# Patient Record
Sex: Male | Born: 1955 | Race: Black or African American | Hispanic: No | Smoking: Current every day smoker
Health system: Southern US, Community
[De-identification: ages and names within clinical notes are randomized; demographics above are authoritative.]

## PROBLEM LIST (undated history)

## (undated) DIAGNOSIS — I82409 Acute embolism and thrombosis of unspecified deep veins of unspecified lower extremity: Secondary | ICD-10-CM

## (undated) DIAGNOSIS — E039 Hypothyroidism, unspecified: Secondary | ICD-10-CM

## (undated) DIAGNOSIS — J449 Chronic obstructive pulmonary disease, unspecified: Secondary | ICD-10-CM

## (undated) DIAGNOSIS — I10 Essential (primary) hypertension: Secondary | ICD-10-CM

## (undated) DIAGNOSIS — M1990 Unspecified osteoarthritis, unspecified site: Secondary | ICD-10-CM

## (undated) DIAGNOSIS — B029 Zoster without complications: Secondary | ICD-10-CM

## (undated) DIAGNOSIS — K469 Unspecified abdominal hernia without obstruction or gangrene: Secondary | ICD-10-CM

## (undated) HISTORY — PX: OTHER SURGICAL HISTORY: SHX169

## (undated) HISTORY — PX: COLONOSCOPY: SHX174

## (undated) HISTORY — PX: ARTHROSCOPY KNEE W/ DRILLING: SUR92

## (undated) HISTORY — PX: HERNIA REPAIR: SHX51

---

## 2004-09-25 ENCOUNTER — Emergency Department: Payer: Self-pay | Admitting: Emergency Medicine

## 2004-09-26 ENCOUNTER — Ambulatory Visit: Payer: Self-pay | Admitting: Ophthalmology

## 2005-10-08 ENCOUNTER — Emergency Department: Payer: Self-pay | Admitting: Emergency Medicine

## 2006-05-21 ENCOUNTER — Ambulatory Visit: Payer: Self-pay | Admitting: Surgery

## 2008-06-27 ENCOUNTER — Emergency Department: Payer: Self-pay | Admitting: Emergency Medicine

## 2010-06-21 ENCOUNTER — Ambulatory Visit: Payer: Self-pay | Admitting: Gastroenterology

## 2011-06-14 ENCOUNTER — Emergency Department: Payer: Self-pay | Admitting: *Deleted

## 2012-02-29 ENCOUNTER — Ambulatory Visit: Payer: Self-pay | Admitting: Internal Medicine

## 2012-03-07 ENCOUNTER — Ambulatory Visit: Payer: Self-pay | Admitting: Gastroenterology

## 2012-03-27 ENCOUNTER — Ambulatory Visit: Payer: Self-pay | Admitting: Internal Medicine

## 2014-04-07 ENCOUNTER — Ambulatory Visit: Payer: Self-pay | Admitting: Gastroenterology

## 2014-06-09 ENCOUNTER — Ambulatory Visit: Payer: Self-pay | Admitting: Gastroenterology

## 2014-10-06 ENCOUNTER — Ambulatory Visit: Payer: Self-pay | Admitting: Gastroenterology

## 2015-07-02 ENCOUNTER — Other Ambulatory Visit: Payer: Self-pay | Admitting: Gastroenterology

## 2015-07-02 DIAGNOSIS — K838 Other specified diseases of biliary tract: Secondary | ICD-10-CM

## 2015-07-02 DIAGNOSIS — K760 Fatty (change of) liver, not elsewhere classified: Secondary | ICD-10-CM

## 2015-07-06 ENCOUNTER — Ambulatory Visit
Admission: RE | Admit: 2015-07-06 | Discharge: 2015-07-06 | Disposition: A | Discharge status: Home / Self Care | Source: Ambulatory Visit | Attending: Gastroenterology | Admitting: Gastroenterology

## 2015-07-06 DIAGNOSIS — K838 Other specified diseases of biliary tract: Secondary | ICD-10-CM | POA: Diagnosis present

## 2015-07-06 DIAGNOSIS — K760 Fatty (change of) liver, not elsewhere classified: Secondary | ICD-10-CM | POA: Insufficient documentation

## 2015-07-06 DIAGNOSIS — K8020 Calculus of gallbladder without cholecystitis without obstruction: Secondary | ICD-10-CM | POA: Insufficient documentation

## 2015-08-16 ENCOUNTER — Encounter: Payer: Self-pay | Admitting: *Deleted

## 2015-08-17 ENCOUNTER — Ambulatory Visit: Admitting: Anesthesiology

## 2015-08-17 ENCOUNTER — Encounter
Admission: RE | Disposition: A | Discharge status: Home / Self Care | Payer: Self-pay | Source: Ambulatory Visit | Attending: Gastroenterology

## 2015-08-17 ENCOUNTER — Ambulatory Visit
Admission: RE | Admit: 2015-08-17 | Discharge: 2015-08-17 | Disposition: A | Discharge status: Home / Self Care | Source: Ambulatory Visit | Attending: Gastroenterology | Admitting: Gastroenterology

## 2015-08-17 ENCOUNTER — Encounter: Payer: Self-pay | Admitting: Anesthesiology

## 2015-08-17 DIAGNOSIS — D123 Benign neoplasm of transverse colon: Secondary | ICD-10-CM | POA: Insufficient documentation

## 2015-08-17 DIAGNOSIS — I10 Essential (primary) hypertension: Secondary | ICD-10-CM | POA: Insufficient documentation

## 2015-08-17 DIAGNOSIS — D124 Benign neoplasm of descending colon: Secondary | ICD-10-CM | POA: Diagnosis not present

## 2015-08-17 DIAGNOSIS — K635 Polyp of colon: Secondary | ICD-10-CM | POA: Insufficient documentation

## 2015-08-17 DIAGNOSIS — Z86010 Personal history of colonic polyps: Secondary | ICD-10-CM | POA: Diagnosis present

## 2015-08-17 DIAGNOSIS — Z86718 Personal history of other venous thrombosis and embolism: Secondary | ICD-10-CM | POA: Diagnosis not present

## 2015-08-17 DIAGNOSIS — D127 Benign neoplasm of rectosigmoid junction: Secondary | ICD-10-CM | POA: Diagnosis not present

## 2015-08-17 DIAGNOSIS — K5730 Diverticulosis of large intestine without perforation or abscess without bleeding: Secondary | ICD-10-CM | POA: Diagnosis not present

## 2015-08-17 DIAGNOSIS — D120 Benign neoplasm of cecum: Secondary | ICD-10-CM | POA: Diagnosis not present

## 2015-08-17 DIAGNOSIS — E039 Hypothyroidism, unspecified: Secondary | ICD-10-CM | POA: Diagnosis not present

## 2015-08-17 DIAGNOSIS — Z79899 Other long term (current) drug therapy: Secondary | ICD-10-CM | POA: Insufficient documentation

## 2015-08-17 DIAGNOSIS — Z885 Allergy status to narcotic agent status: Secondary | ICD-10-CM | POA: Insufficient documentation

## 2015-08-17 HISTORY — DX: Unspecified osteoarthritis, unspecified site: M19.90

## 2015-08-17 HISTORY — DX: Unspecified abdominal hernia without obstruction or gangrene: K46.9

## 2015-08-17 HISTORY — DX: Hypothyroidism, unspecified: E03.9

## 2015-08-17 HISTORY — PX: COLONOSCOPY WITH PROPOFOL: SHX5780

## 2015-08-17 HISTORY — DX: Essential (primary) hypertension: I10

## 2015-08-17 HISTORY — DX: Zoster without complications: B02.9

## 2015-08-17 HISTORY — DX: Acute embolism and thrombosis of unspecified deep veins of unspecified lower extremity: I82.409

## 2015-08-17 LAB — PROTIME-INR
INR: 1.06
Prothrombin Time: 14 seconds (ref 11.4–15.0)

## 2015-08-17 LAB — PLATELET COUNT: PLATELETS: 109 10*3/uL — AB (ref 150–440)

## 2015-08-17 SURGERY — COLONOSCOPY WITH PROPOFOL
Anesthesia: General

## 2015-08-17 MED ORDER — PROPOFOL 10 MG/ML IV BOLUS
INTRAVENOUS | Status: DC | PRN
  Administered 2015-08-17: 70 mg via INTRAVENOUS

## 2015-08-17 MED ORDER — SODIUM CHLORIDE 0.9 % IV SOLN
INTRAVENOUS | Status: DC
  Administered 2015-08-17: 10:00:00 via INTRAVENOUS

## 2015-08-17 MED ORDER — LIDOCAINE HCL (CARDIAC) 20 MG/ML IV SOLN
INTRAVENOUS | Status: DC | PRN
  Administered 2015-08-17: 40 mg via INTRAVENOUS

## 2015-08-17 MED ORDER — FENTANYL CITRATE (PF) 100 MCG/2ML IJ SOLN
INTRAMUSCULAR | Status: DC | PRN
  Administered 2015-08-17: 50 ug via INTRAVENOUS

## 2015-08-17 MED ORDER — SODIUM CHLORIDE 0.9 % IV SOLN
INTRAVENOUS | Status: DC
  Administered 2015-08-17: 1000 mL via INTRAVENOUS

## 2015-08-17 MED ORDER — PROPOFOL INFUSION 10 MG/ML OPTIME
INTRAVENOUS | Status: DC | PRN
  Administered 2015-08-17: 140 ug/kg/min via INTRAVENOUS

## 2015-08-17 MED ORDER — EPHEDRINE SULFATE 50 MG/ML IJ SOLN
INTRAMUSCULAR | Status: DC | PRN
  Administered 2015-08-17: 5 mg via INTRAVENOUS
  Administered 2015-08-17: 15 mg via INTRAVENOUS

## 2015-08-17 MED ORDER — MIDAZOLAM HCL 5 MG/5ML IJ SOLN
INTRAMUSCULAR | Status: DC | PRN
  Administered 2015-08-17: 2 mg via INTRAVENOUS

## 2015-08-17 MED ORDER — SODIUM CHLORIDE 0.9 % IV SOLN
2.0000 g | INTRAVENOUS | Status: AC
  Administered 2015-08-17: 2 g via INTRAVENOUS
  Filled 2015-08-17: qty 2000

## 2015-08-17 NOTE — Op Note (Signed)
[facility] Gastroenterology Patient Name: Tony Beck Procedure Date: 08/17/2015 10:01 AM MRN: [id number] Account #: [id number] Date of Birth: [birth year] Admit Type: Outpatient Age: 59 Room: [facility] ENDO ROOM 3 Gender: Male Note Status: Finalized Procedure:         Colonoscopy Indications:       Personal history of colonic polyps Providers:         Lollie Sails, MD Referring MD:      Ocie Cornfield. Ouida Sills, MD (Referring MD) Medicines:         Monitored Anesthesia Care Complications:     No immediate complications. Procedure:         Pre-Anesthesia Assessment:                    - ASA Grade Assessment: III - A patient with severe                     systemic disease.                    After obtaining informed consent, the colonoscope was                     passed under direct vision. Throughout the procedure, the                     patient's blood pressure, pulse, and oxygen saturations                     were monitored continuously. The Colonoscope was                     introduced through the anus and advanced to the the cecum,                     identified by appendiceal orifice and ileocecal valve. The                     colonoscopy was performed with moderate difficulty due to                     poor bowel prep. Successful completion of the procedure                     was aided by lavage. Findings:      A 3 mm polyp and 5 mm polyp was found in the recto-sigmoid colon. The       polyps were sessile. The polyp was removed with a cold biopsy forceps.       Resection and retrieval were complete. The polyp was removed with a cold       snare. Resection and retrieval were complete.      A 2 mm polyp was found in the proximal transverse colon. The polyp was       flat. The polyp was removed with a cold biopsy forceps. Resection and       retrieval were complete.      A 2 mm polyp was found at the appendiceal orifice. The polyp was       sessile.  The polyp was removed with a cold biopsy forceps. Resection and       retrieval were complete.      A 2 mm polyp was found in the cecum. The polyp was flat. The polyp was  removed with a cold biopsy forceps. Resection and retrieval were       complete.      A 3 mm polyp was found in the descending colon. The polyp was sessile.       The polyp was removed with a cold biopsy forceps. Resection and       retrieval were complete.      A patchy area of marked erythematous mucosa was found at 25 cm proximal       to the anus. There were 4 distinct areas inflamed with thickening of the       edge of the involved folds. Biopsies were taken of several small folds       together, then 2 larger folds, these placed in separate folds.      Multiple small-mouthed diverticula were found in the mid sigmoid colon       and in the distal sigmoid colon. Erythema was seen in association with       the diverticular opening. Petechia were visualized in association with       the diverticular opening.      The digital rectal exam was normal.      The retroflexed view of the distal rectum and anal verge was normal and       showed no anal or rectal abnormalities. Impression:        - One 3 mm polyp at the recto-sigmoid colon. Resected and                     retrieved.                    - One 2 mm polyp in the proximal transverse colon.                     Resected and retrieved.                    - One 2 mm polyp at the appendiceal orifice. Resected and                     retrieved.                    - One 2 mm polyp in the cecum. Resected and retrieved.                    - One 3 mm polyp in the descending colon. Resected and                     retrieved.                    - Erythematous mucosa at 25 cm proximal to the anus.                    - Moderate diverticulosis in the mid sigmoid colon and in                     the distal sigmoid colon. Erythema was seen in association                      with the diverticular opening. Petechia were visualized in                     association with the diverticular opening.                    -  The distal rectum and anal verge are normal on                     retroflexion view. Recommendation:    - Await pathology results.                    - Cipro (ciprofloxacin) 500 mg PO BID for 10 days.                    - Flagyl (metronidazole) 250 mg PO TID for 10 days. Procedure Code(s): --- Professional ---                    [phone], Colonoscopy, flexible; with removal of tumor(s),                     polyp(s), or other lesion(s) by snare technique                    45380, 60, Colonoscopy, flexible; with biopsy, single or                     multiple Diagnosis Code(s): --- Professional ---                    211.4, Benign neoplasm of rectum and anal canal                    211.3, Benign neoplasm of colon                    569.89, Other specified disorders of intestine                    V12.72, Personal history of colonic polyps                    562.10, Diverticulosis of colon (without mention of                     hemorrhage) CPT copyright 2014 American [employer]. All rights reserved. The codes documented in this report are preliminary and upon coder review may  be revised to meet current compliance requirements. Lollie Sails, MD 08/17/2015 11:05:07 AM This report has been signed electronically. Number of Addenda: 0 Note Initiated On: 08/17/2015 10:01 AM Scope Withdrawal Time: 0 hours 20 minutes 22 seconds  Total Procedure Duration: 0 hours 32 minutes 22 seconds       [facility]

## 2015-08-17 NOTE — Anesthesia Postprocedure Evaluation (Signed)
  Anesthesia Post-op Note  Patient: Tony Beck  Procedure(s) Performed: Procedure(s): COLONOSCOPY WITH PROPOFOL (N/A)  Anesthesia type:General  Patient location: PACU  Post pain: Pain level controlled  Post assessment: Post-op Vital signs reviewed, Patient's Cardiovascular Status Stable, Respiratory Function Stable, Patent Airway and No signs of Nausea or vomiting  Post vital signs: Reviewed and stable  Last Vitals:  Filed Vitals:   08/17/15 1135  BP: 146/87  Pulse: 42  Temp:   Resp: 17    Level of consciousness: awake, alert  and patient cooperative  Complications: No apparent anesthesia complications

## 2015-08-17 NOTE — Anesthesia Preprocedure Evaluation (Addendum)
Anesthesia Evaluation  Patient identified by MRN, date of birth, ID band Patient awake    Reviewed: Allergy & Precautions, NPO status , Patient's Chart, lab work & pertinent test results, reviewed documented beta blocker date and time   Airway Mallampati: II  TM Distance: >3 FB     Dental  (+) Chipped   Pulmonary           Cardiovascular hypertension, Pt. on medications and Pt. on home beta blockers      Neuro/Psych    GI/Hepatic   Endo/Other  Hypothyroidism   Renal/GU      Musculoskeletal  (+) Arthritis ,   Abdominal   Peds  Hematology   Anesthesia Other Findings   Reproductive/Obstetrics                            Anesthesia Physical Anesthesia Plan  ASA: II  Anesthesia Plan: General   Post-op Pain Management:    Induction: Intravenous  Airway Management Planned: Nasal Cannula  Additional Equipment:   Intra-op Plan:   Post-operative Plan:   Informed Consent: I have reviewed the patients History and Physical, chart, labs and discussed the procedure including the risks, benefits and alternatives for the proposed anesthesia with the patient or authorized representative who has indicated his/her understanding and acceptance.     Plan Discussed with: CRNA  Anesthesia Plan Comments:         Anesthesia Quick Evaluation

## 2015-08-17 NOTE — Transfer of Care (Signed)
Immediate Anesthesia Transfer of Care Note  Patient: Tony Beck  Procedure(s) Performed: Procedure(s): COLONOSCOPY WITH PROPOFOL (N/A)  Patient Location: PACU  Anesthesia Type:General  Level of Consciousness: awake and alert   Airway & Oxygen Therapy: Patient Spontanous Breathing and Patient connected to nasal cannula oxygen  Post-op Assessment: Report given to RN and Post -op Vital signs reviewed and stable  Post vital signs: Reviewed and stable  Last Vitals:  Filed Vitals:   08/17/15 0902  BP: 130/75  Pulse: 44  Temp: 36.4 C  Resp: 17    Complications: No apparent anesthesia complications

## 2015-08-17 NOTE — H&P (Signed)
Outpatient short stay form Pre-procedure 08/17/2015 10:13 AM Lollie Sails MD  Primary Physician: Dr. Frazier Richards  Reason for visit:  Colonoscopy  History of present illness:  Patient is a 59 year old male with a personal history of adenomatous colon polyps. He tolerated his prep well. He takes no aspirin or blood thinning products. He does however have a history of steatosis. INR this morning is 1.06. Platelet count was 109.    Current facility-administered medications:  .  0.9 %  sodium chloride infusion, , Intravenous, Continuous, Lollie Sails, MD .  0.9 %  sodium chloride infusion, , Intravenous, Continuous, Lollie Sails, MD, Last Rate: 20 mL/hr at 08/17/15 0921, 1,000 mL at 08/17/15 1761  Prescriptions prior to admission  Medication Sig Dispense Refill Last Dose  . amLODipine (NORVASC) 10 MG tablet Take 10 mg by mouth daily.   08/17/2015 at 0630  . carvedilol (COREG) 12.5 MG tablet Take 12.5 mg by mouth 2 (two) times daily with a meal.   08/17/2015 at 0630  . cyanocobalamin 1000 MCG tablet Take 100 mcg by mouth daily.   08/16/2015 at Unknown time  . esomeprazole (NEXIUM) 40 MG capsule Take 40 mg by mouth daily at 12 noon.   08/17/2015 at 0630  . levothyroxine (SYNTHROID, LEVOTHROID) 125 MCG tablet Take 125 mcg by mouth daily before breakfast.   08/17/2015 at 0630  . naproxen (NAPROSYN) 500 MG tablet Take 500 mg by mouth 2 (two) times daily with a meal.   08/16/2015 at Unknown time     Allergies  Allergen Reactions  . Codeine      Past Medical History  Diagnosis Date  . Hypertension   . Arthritis   . Hypothyroidism   . DVT (deep venous thrombosis)   . Shingles   . Abdominal hernia     Review of systems:      Physical Exam    Heart and lungs: Regular rate and rhythm without rub or gallop, lungs are bilaterally clear.    HEENT: Normocephalic atraumatic eyes are anicteric    Other:     Pertinant exam for procedure: Soft nontender nondistended bowel  sounds positive normoactive    Planned proceedures: Anoscopy and indicated procedures I have discussed the risks benefits and complications of procedures to include not limited to bleeding, infection, perforation and the risk of sedation and the patient wishes to proceed.  Lollie Sails, MD Gastroenterology 08/17/2015  10:13 AM

## 2015-08-18 ENCOUNTER — Encounter: Payer: Self-pay | Admitting: Gastroenterology

## 2015-08-18 LAB — SURGICAL PATHOLOGY

## 2015-09-03 ENCOUNTER — Encounter
Admission: RE | Disposition: A | Discharge status: Home / Self Care | Payer: Self-pay | Source: Ambulatory Visit | Attending: Gastroenterology

## 2015-09-03 ENCOUNTER — Ambulatory Visit
Admission: RE | Admit: 2015-09-03 | Discharge: 2015-09-03 | Disposition: A | Discharge status: Home / Self Care | Source: Ambulatory Visit | Attending: Gastroenterology | Admitting: Gastroenterology

## 2015-09-03 DIAGNOSIS — E039 Hypothyroidism, unspecified: Secondary | ICD-10-CM | POA: Insufficient documentation

## 2015-09-03 DIAGNOSIS — I10 Essential (primary) hypertension: Secondary | ICD-10-CM | POA: Insufficient documentation

## 2015-09-03 DIAGNOSIS — J449 Chronic obstructive pulmonary disease, unspecified: Secondary | ICD-10-CM | POA: Insufficient documentation

## 2015-09-03 DIAGNOSIS — K621 Rectal polyp: Secondary | ICD-10-CM | POA: Insufficient documentation

## 2015-09-03 DIAGNOSIS — Z09 Encounter for follow-up examination after completed treatment for conditions other than malignant neoplasm: Secondary | ICD-10-CM | POA: Diagnosis present

## 2015-09-03 DIAGNOSIS — Z86718 Personal history of other venous thrombosis and embolism: Secondary | ICD-10-CM | POA: Diagnosis not present

## 2015-09-03 DIAGNOSIS — K5730 Diverticulosis of large intestine without perforation or abscess without bleeding: Secondary | ICD-10-CM | POA: Diagnosis not present

## 2015-09-03 DIAGNOSIS — Z86010 Personal history of colonic polyps: Secondary | ICD-10-CM | POA: Insufficient documentation

## 2015-09-03 HISTORY — PX: FLEXIBLE SIGMOIDOSCOPY: SHX5431

## 2015-09-03 HISTORY — DX: Chronic obstructive pulmonary disease, unspecified: J44.9

## 2015-09-03 SURGERY — SIGMOIDOSCOPY, FLEXIBLE
Anesthesia: General

## 2015-09-03 MED ORDER — FENTANYL CITRATE (PF) 100 MCG/2ML IJ SOLN
INTRAMUSCULAR | Status: AC
  Filled 2015-09-03: qty 2

## 2015-09-03 MED ORDER — MIDAZOLAM HCL 5 MG/5ML IJ SOLN
INTRAMUSCULAR | Status: DC | PRN
  Administered 2015-09-03: 2 mg via INTRAVENOUS
  Administered 2015-09-03: 1 mg via INTRAVENOUS

## 2015-09-03 MED ORDER — SODIUM CHLORIDE 0.9 % IV SOLN
1.0000 g | Freq: Once | INTRAVENOUS | Status: AC
  Administered 2015-09-03: 1 g via INTRAVENOUS

## 2015-09-03 MED ORDER — SODIUM CHLORIDE 0.9 % IV SOLN: INTRAVENOUS | Status: DC

## 2015-09-03 MED ORDER — MIDAZOLAM HCL 5 MG/5ML IJ SOLN
INTRAMUSCULAR | Status: AC
  Filled 2015-09-03: qty 5

## 2015-09-03 MED ORDER — SODIUM CHLORIDE 0.9 % IV SOLN
INTRAVENOUS | Status: DC
  Administered 2015-09-03: 13:00:00 via INTRAVENOUS

## 2015-09-03 NOTE — H&P (Signed)
Outpatient short stay form Pre-procedure 09/03/2015 12:58 PM Tony Sails MD  Primary Physician: Dr. Frazier Richards  Reason for visit:  Flexible sigmoidoscopy  History of present illness:  Patient is a 59 year old male presenting today for follow-up on a previous colonoscopy done 08/17/2015. At that time there were several polyps removed these all being consistent with tubular adenomas however there were multiple folds in the sigmoid colon at about 25 cm that were markedly inflamed. Biopsies were taken indicating possible ischemia. He was in a prescription for anti-biotics to be done and then brought back in for a reevaluation of that area. Patient has been asymptomatic or pain in that area.   No current facility-administered medications for this encounter.  Prescriptions prior to admission  Medication Sig Dispense Refill Last Dose  . amLODipine (NORVASC) 10 MG tablet Take 10 mg by mouth daily.   09/03/2015 at 0700  . carvedilol (COREG) 12.5 MG tablet Take 12.5 mg by mouth 2 (two) times daily with a meal.   09/03/2015 at 0700  . cyanocobalamin 1000 MCG tablet Take 100 mcg by mouth daily.   Past Week at Unknown time  . esomeprazole (NEXIUM) 40 MG capsule Take 40 mg by mouth daily at 12 noon.   09/03/2015 at 0700  . levothyroxine (SYNTHROID, LEVOTHROID) 125 MCG tablet Take 125 mcg by mouth daily before breakfast.   09/03/2015 at 0700  . naproxen (NAPROSYN) 500 MG tablet Take 500 mg by mouth 2 (two) times daily with a meal.   Past Week at Unknown time     Allergies  Allergen Reactions  . Codeine      Past Medical History  Diagnosis Date  . Hypertension   . Arthritis   . Hypothyroidism   . DVT (deep venous thrombosis) ([place])   . Shingles   . Abdominal hernia   . COPD (chronic obstructive pulmonary disease) ([facility])     Review of systems:      Physical Exam    Heart and lungs: Regular rate and rhythm without rub or gallop, lungs are bilaterally clear    HEENT: Normocephalic  atraumatic eyes are anicteric    Other:     Pertinant exam for procedure: Soft nontender nondistended bowel sounds positive normoactive    Planned proceedures: Sedated Flexible sigmoidoscopy. I have discussed the risks benefits and complications of procedures to include not limited to bleeding, infection, perforation and the risk of sedation and the patient wishes to proceed.    Tony Sails, MD Gastroenterology 09/03/2015  12:58 PM

## 2015-09-03 NOTE — Op Note (Signed)
[facility] Gastroenterology Patient Name: Tony Beck Procedure Date: 09/03/2015 1:34 PM MRN: [id number] Account #: [id number] Date of Birth: 07-26-1956 Admit Type: Outpatient Age: 59 Room: [facility] ENDO ROOM 3 Gender: Male Note Status: Finalized Procedure:         Flexible Sigmoidoscopy Indications:       follow up of previous abnormal findings. Providers:         Lollie Sails, MD Referring MD:      Ocie Cornfield. Ouida Sills, MD (Referring MD) Medicines:         Ampicillin 1 g IV, Midazolam 3 mg IV Complications:     No immediate complications. Procedure:         Pre-Anesthesia Assessment:                    - ASA Grade Assessment: III - A patient with severe                     systemic disease.                    After obtaining informed consent, the scope was passed                     under direct vision. The Colonoscope was introduced                     through the anus and advanced to the 30 cm from the anal                     verge. The flexible sigmoidoscopy was accomplished without                     difficulty. The patient tolerated the procedure well. The                     quality of the bowel preparation was good. Findings:      Multiple diverticula were found in the sigmoid colon and in the distal       sigmoid colon. The previously noted areas of inflammation at about 25 cm       from the anal verge were easily located. The inflammation seemed to be       less marked. Again there was no distinct polyp, but thickened areas of       mucosa on the edge of several folds. Biopsies were taken for histology       from the more irritated area at 27cm. There were several more folds at       25 cm that were also inflamed, and biopsies were taken.      Four sessile polyps were found in the rectum. The polyps were 1 mm in       size. These polyps were removed with a cold biopsy forceps. Resection       and retrieval were complete.      The digital  rectal exam was normal. Impression:        - Diverticulosis in the sigmoid colon and in the distal                     sigmoid colon.                    - Four 1 mm polyps in the rectum. Resected and retrieved. Recommendation:    -  Return to GI clinic in 6 weeks. Procedure Code(s): --- Professional ---                    [phone], Sigmoidoscopy, flexible; with biopsy, single or                     multiple Diagnosis Code(s): --- Professional ---                    569.0, Anal and rectal polyp                    562.10, Diverticulosis of colon (without mention of                     hemorrhage) CPT copyright 2014 American Medical Association. All rights reserved. The codes documented in this report are preliminary and upon coder review may  be revised to meet current compliance requirements. Lollie Sails, MD 09/03/2015 2:04:09 PM This report has been signed electronically. Number of Addenda: 0 Note Initiated On: 09/03/2015 1:34 PM      [facility]

## 2015-09-06 ENCOUNTER — Encounter: Payer: Self-pay | Admitting: Gastroenterology

## 2015-09-07 LAB — SURGICAL PATHOLOGY

## 2015-11-15 IMAGING — US ABDOMEN ULTRASOUND
1 series · 13 of 25 positions shown · non-contrast
Comparison: 04/07/2014

CLINICAL DATA: Fatty liver.  Compared to 04/07/2014

EXAM:
ULTRASOUND ABDOMEN COMPLETE

[Series 1: abdomen ultrasound · 0.31mm/px · 13 of 66 slices shown]
[im 1/66]
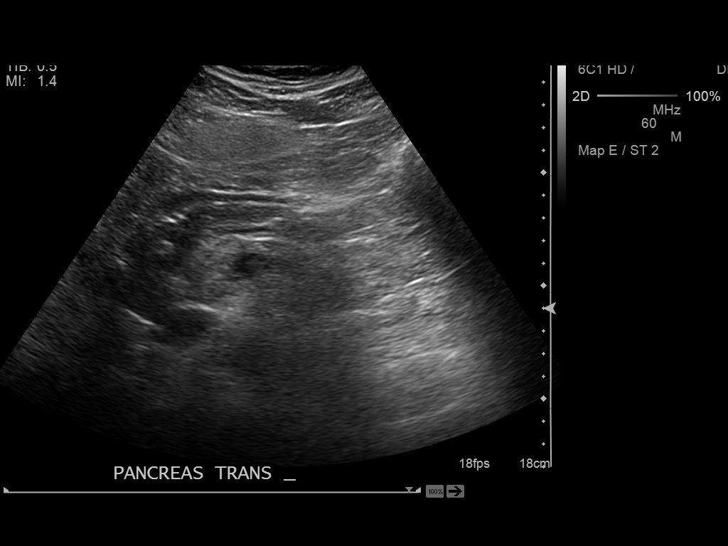
[im 6/66]
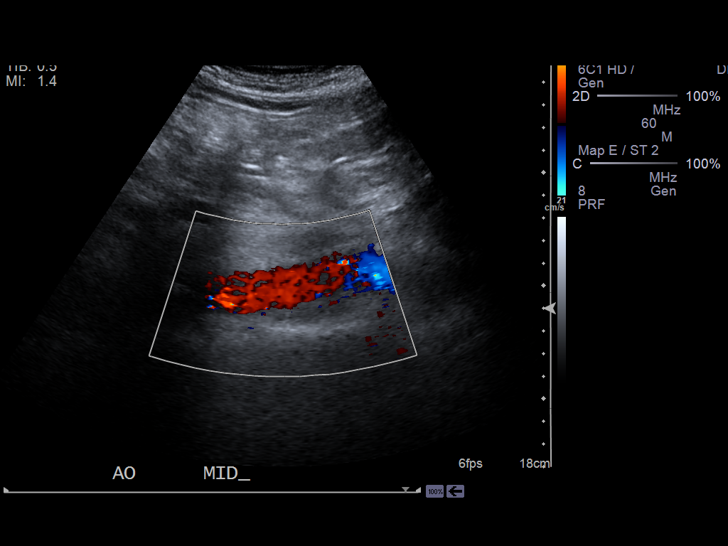
[im 11/66]
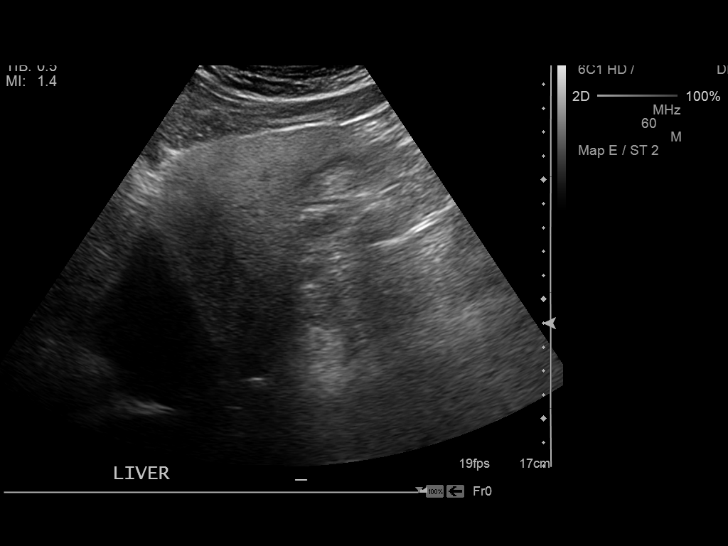
[im 17/66]
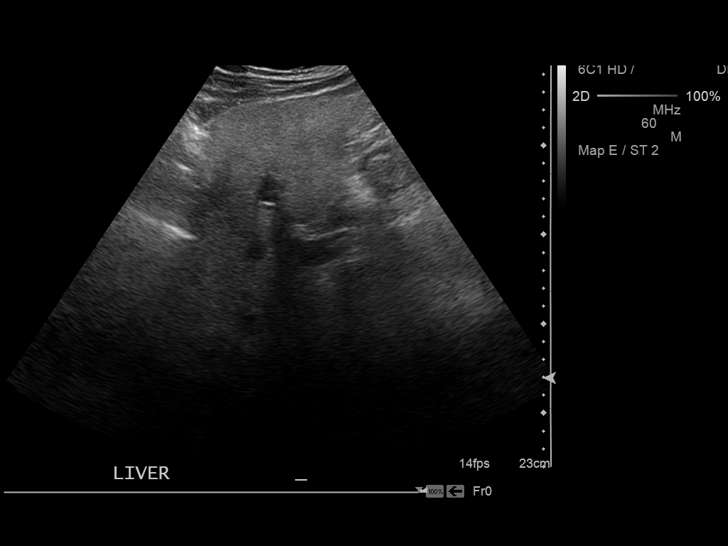
[im 22/66]
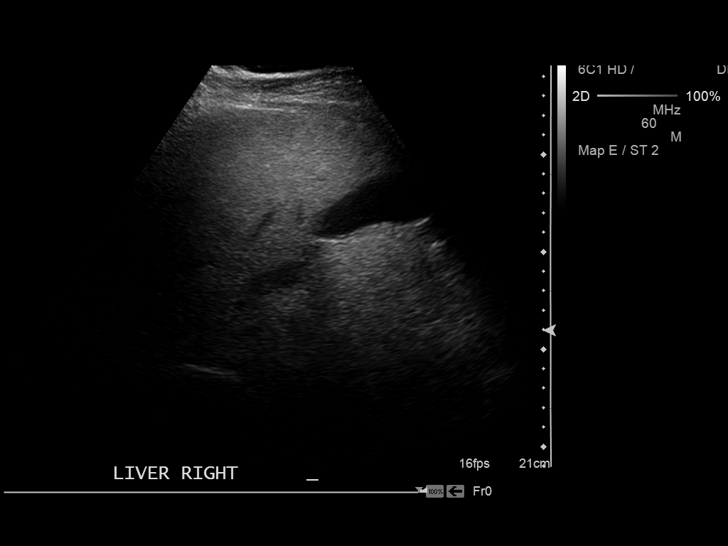
[im 28/66]
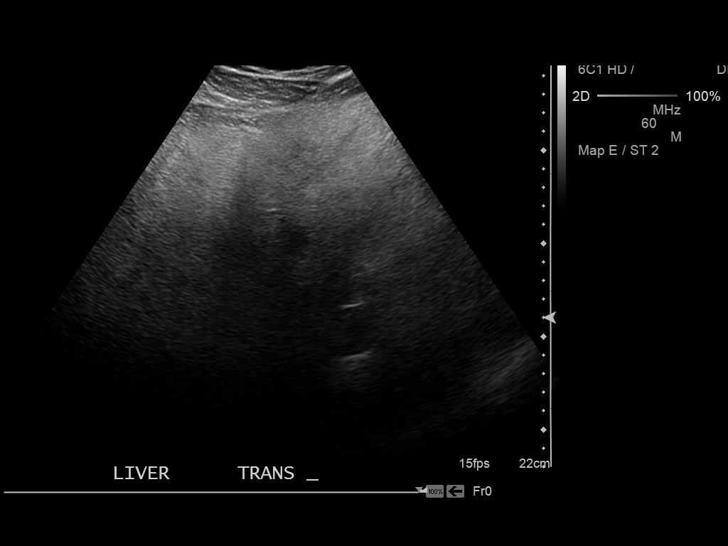
[im 33/66]
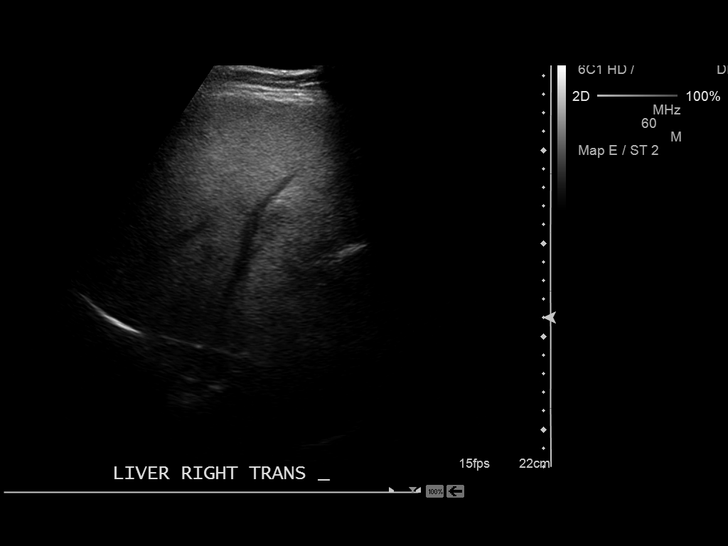
[im 38/66]
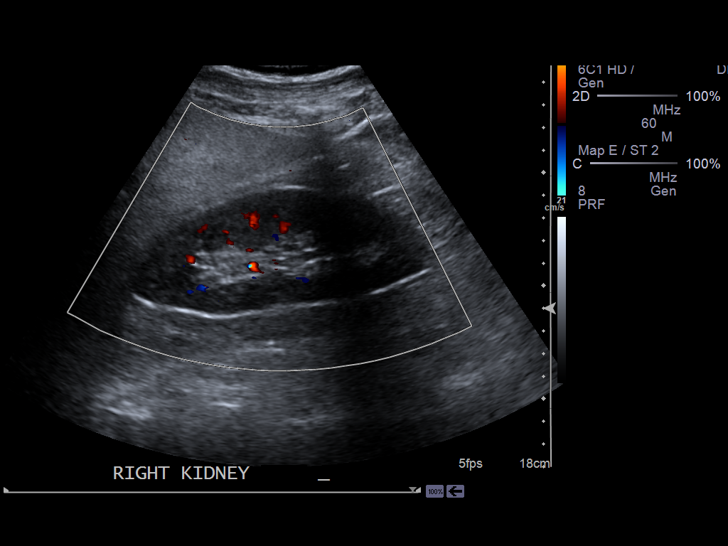
[im 44/66]
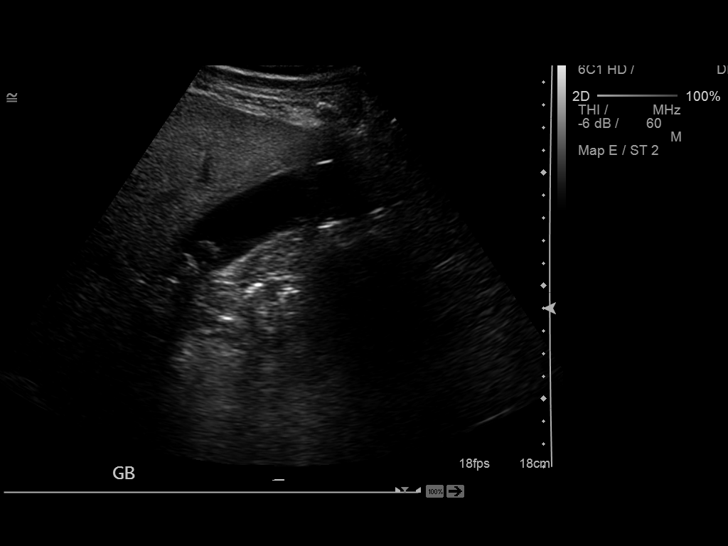
[im 49/66]
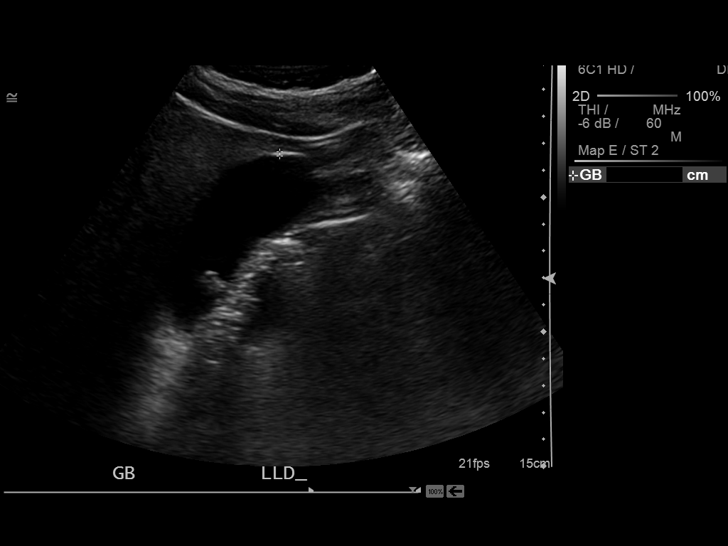
[im 55/66]
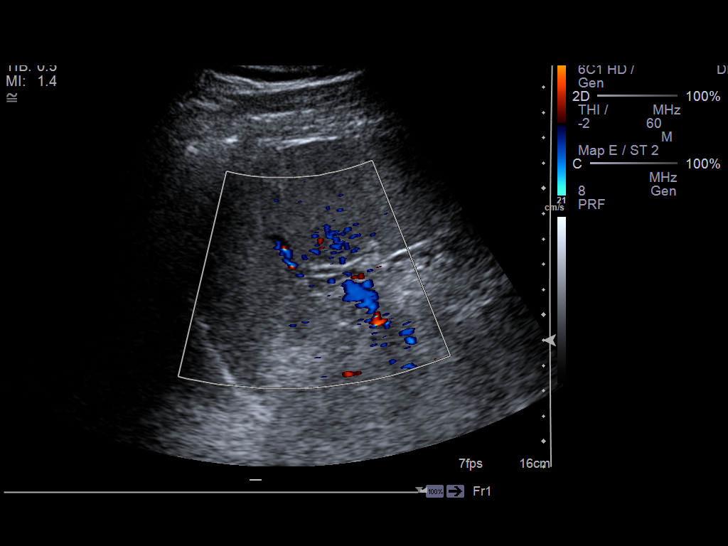
[im 60/66]
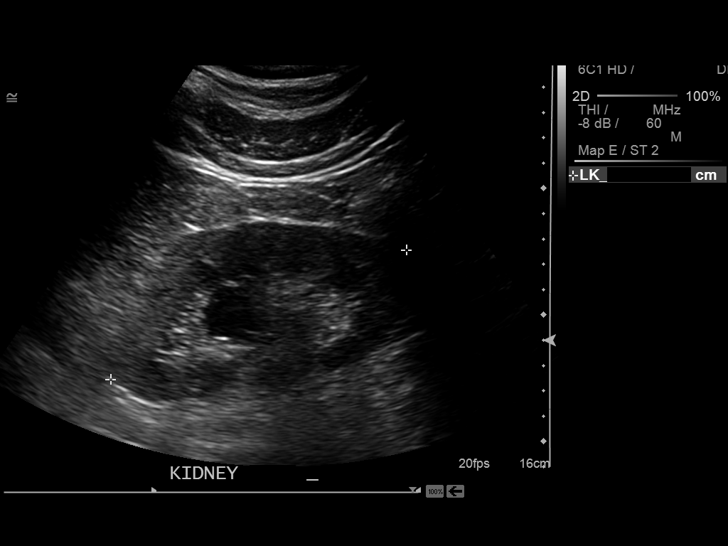
[im 66/66]
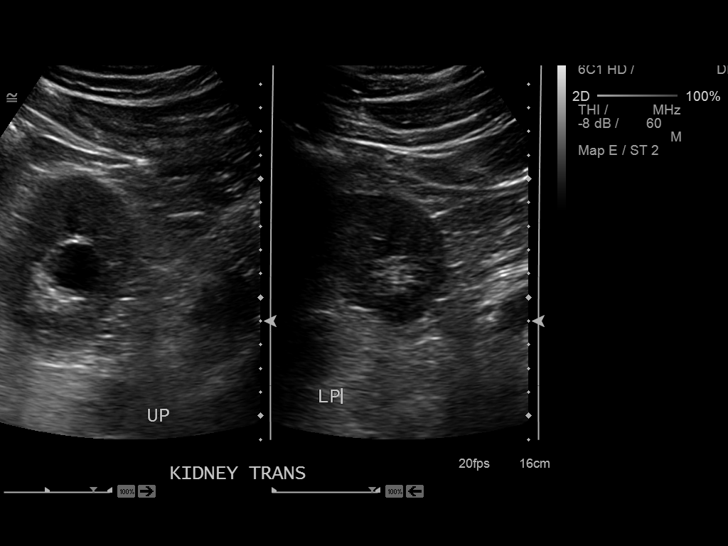

[13 of 25 positions shown; findings below may reference images not displayed]

FINDINGS: Gallbladder: Multiple shadowing and mobile gallstones. No wall
thickening or focal tenderness.

Common bile duct: Diameter: 8 mm. Where visualized, no filling
defect. Although the common bile duct diameter is similar to that
measured 04/07/2014, it is twice the diameter as seen on MRCP
06/09/2014.

Liver: Diffuse increased echogenicity consistent with steatosis.
Subjectively, the steatosis is similar to March 2014. No focal lesion.

IVC: No abnormality visualized.

Pancreas: Visualized portion unremarkable.

Spleen: Size and appearance within normal limits.

Right Kidney: Length: 13.8 cm. There is a simple 2 cm cyst in the
hilar region. No hydronephrosis.

Left Kidney: Length: 12.8 cm. Echogenicity within normal limits. No
mass or hydronephrosis visualized.

Abdominal aorta: No aneurysm visualized.

Other findings: None.
IMPRESSION: 1. Persistent hepatic steatosis. Standard ultrasound is not
quantitative for steatosis, but it is subjectively similar to
04/07/2014.
2. New dilation of the common bile duct at 8-9 mm. Please use liver
function tests to evaluate for new biliary obstruction.
3. Cholelithiasis.

## 2016-03-14 IMAGING — US US ABDOMEN COMPLETE
1 series · 14 of 25 positions shown · non-contrast
Comparison: 10/06/2014.

CLINICAL DATA: Fatty liver.

EXAM:
ULTRASOUND ABDOMEN COMPLETE

[Series 1: us abdomen complete · 0.20mm/px · 14 of 99 slices shown]
[im 1/99]
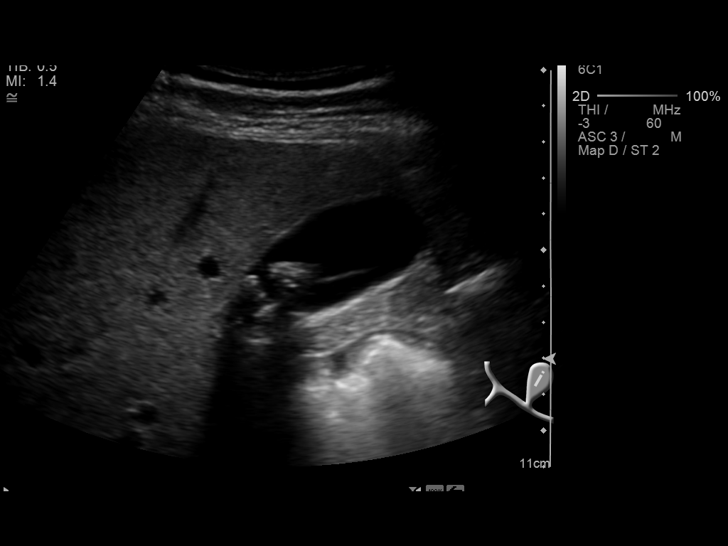
[im 9/99]
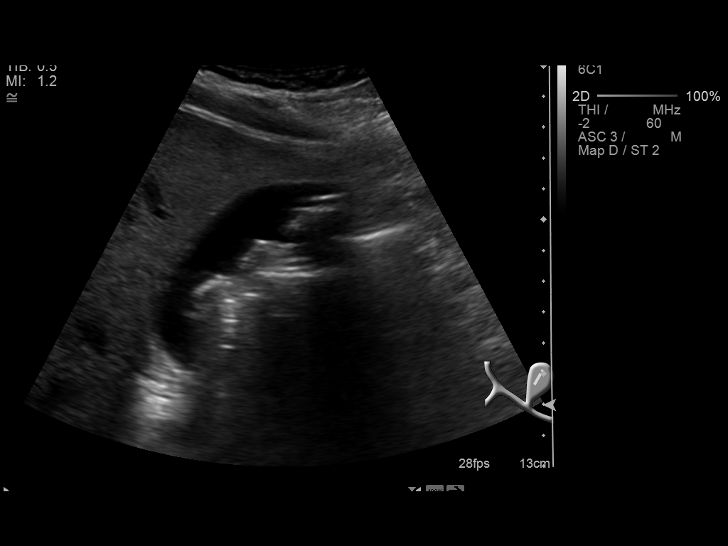
[im 17/99]
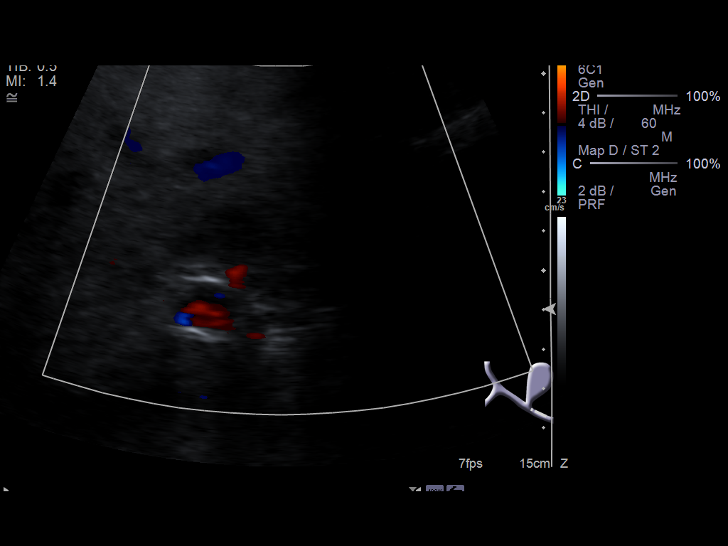
[im 25/99]
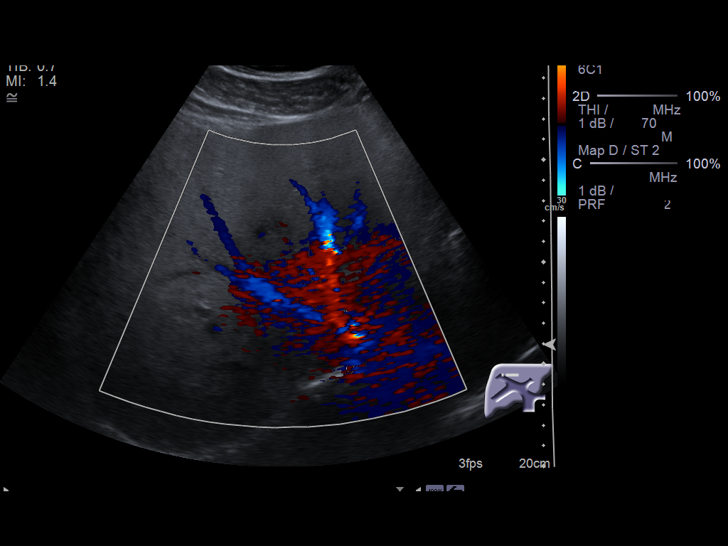
[im 33/99]
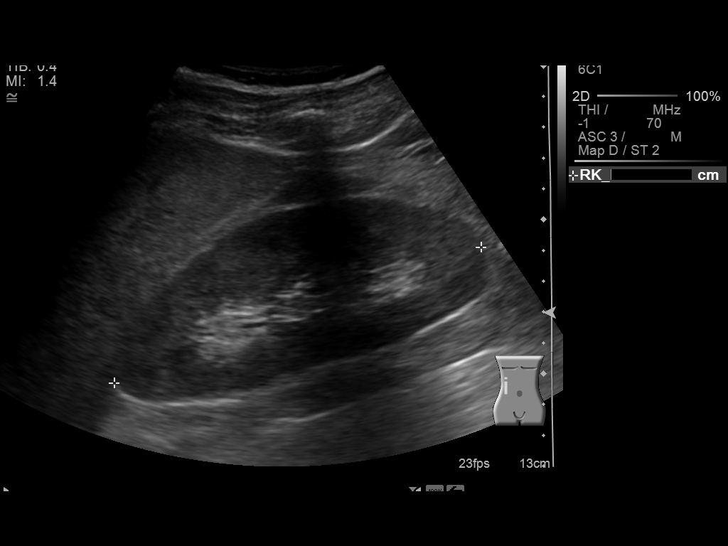
[im 37/99]
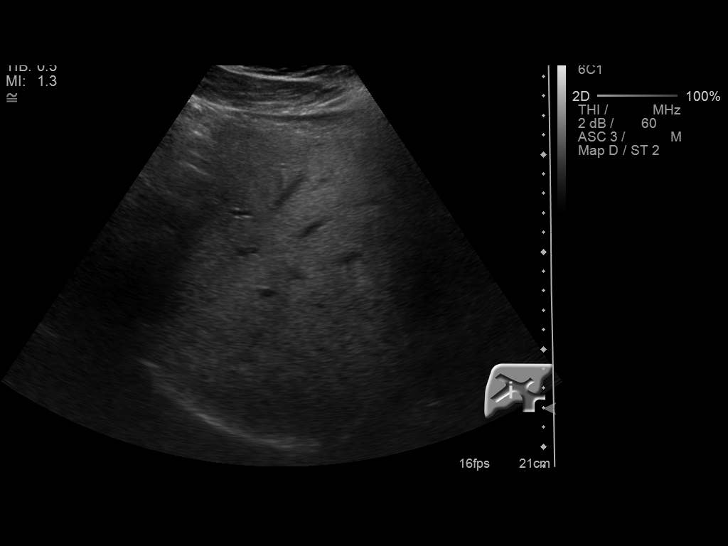
[im 45/99]
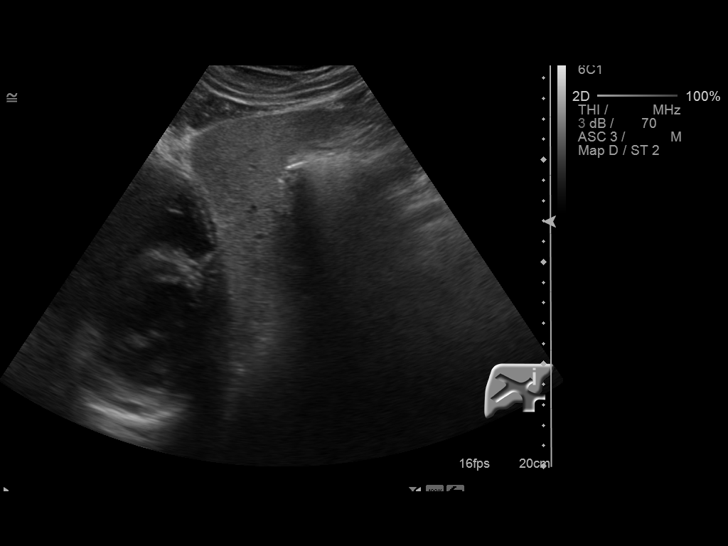
[im 54/99]
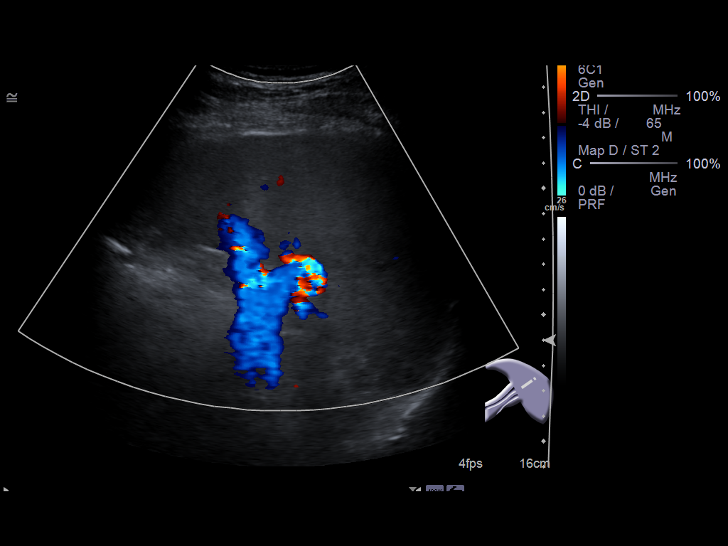
[im 62/99]
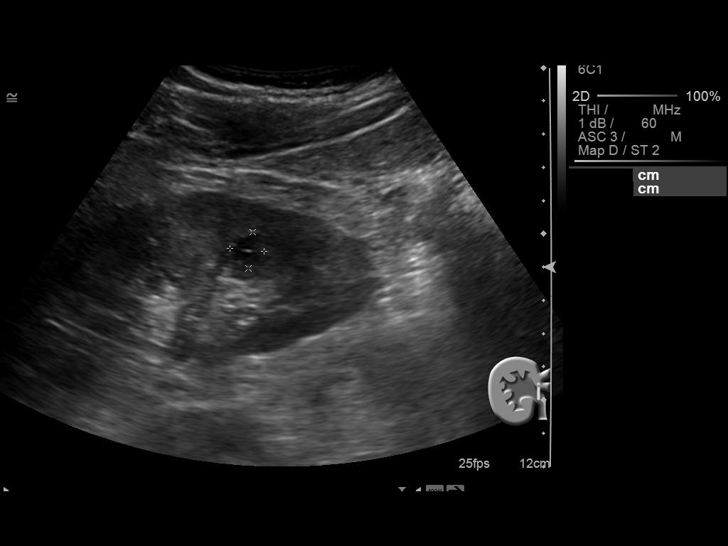
[im 66/99]
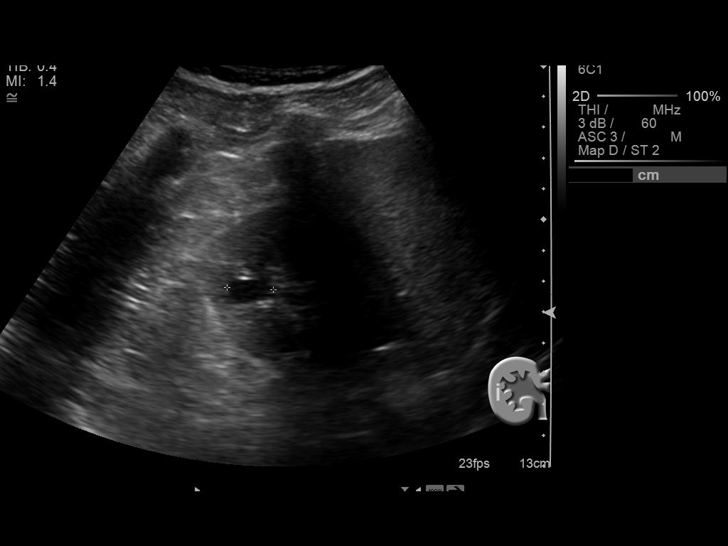
[im 74/99]
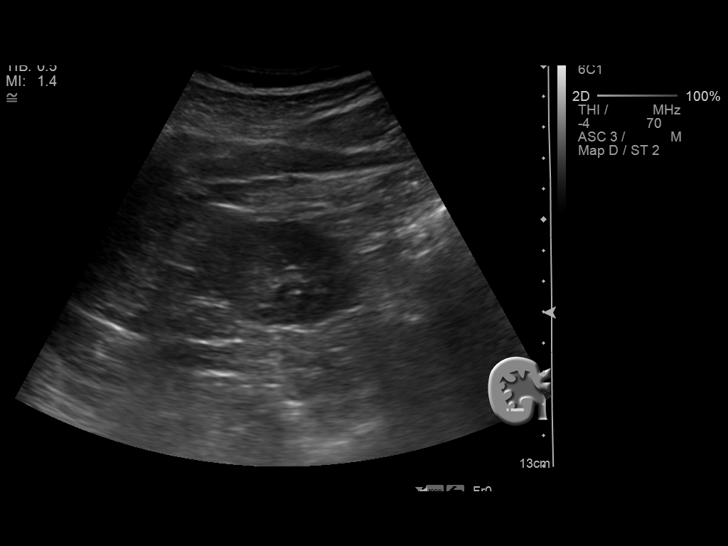
[im 82/99]
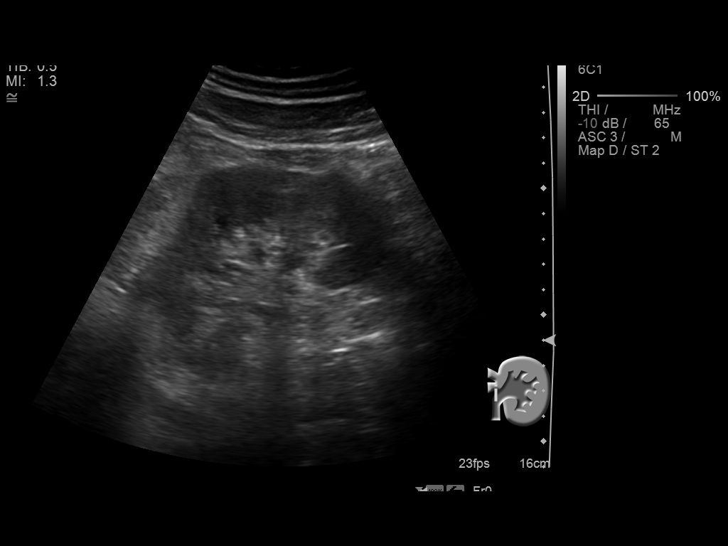
[im 90/99]
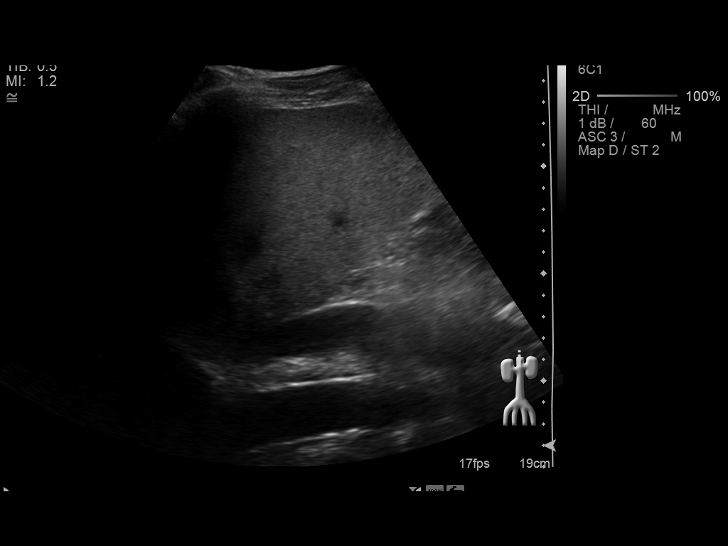
[im 99/99]
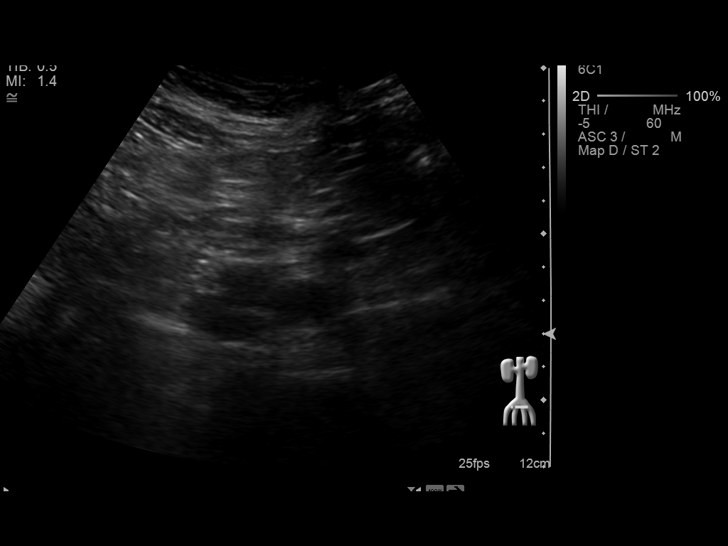

[14 of 25 positions shown; findings below may reference images not displayed]

FINDINGS: Gallbladder: Multiple gallstones. Largest measures 1.9 cm.
Gallbladder wall thickness normal. Negative Murphy sign.

Common bile duct: Diameter: 3.1 mm

Liver: Liver is echogenic consistent with fatty infiltration and/or
hepatocellular disease.

IVC: No abnormality visualized.

Pancreas: Visualized portion unremarkable.

Spleen: Size and appearance within normal limits.

Right Kidney: Length: 12.8 Cm. Echogenicity within normal limits. No
hydronephrosis visualized. Two small parapelvic cysts, the largest
measures 1.5 cm

Left Kidney: Length: 12.4 cm. Echogenicity within normal limits. No
hydronephrosis visualized. 2.5 cm parapelvic cyst.

Abdominal aorta: No aneurysm visualized.

Other findings: None.
IMPRESSION: 1. Multiple gallstones.

2. Liver is echogenic suggesting fatty infiltration and/or
hepatocellular disease.

## 2016-05-10 ENCOUNTER — Other Ambulatory Visit: Payer: Self-pay | Admitting: Gastroenterology

## 2016-05-10 DIAGNOSIS — K760 Fatty (change of) liver, not elsewhere classified: Secondary | ICD-10-CM

## 2016-07-04 ENCOUNTER — Ambulatory Visit
Admission: RE | Admit: 2016-07-04 | Discharge: 2016-07-04 | Disposition: A | Discharge status: Home / Self Care | Source: Ambulatory Visit | Attending: Gastroenterology | Admitting: Gastroenterology

## 2016-07-04 ENCOUNTER — Ambulatory Visit

## 2016-07-04 DIAGNOSIS — N281 Cyst of kidney, acquired: Secondary | ICD-10-CM | POA: Diagnosis not present

## 2016-07-04 DIAGNOSIS — K760 Fatty (change of) liver, not elsewhere classified: Secondary | ICD-10-CM | POA: Insufficient documentation

## 2016-07-04 DIAGNOSIS — K8020 Calculus of gallbladder without cholecystitis without obstruction: Secondary | ICD-10-CM | POA: Insufficient documentation

## 2016-11-16 ENCOUNTER — Other Ambulatory Visit: Payer: Self-pay | Admitting: Gastroenterology

## 2016-11-16 DIAGNOSIS — K760 Fatty (change of) liver, not elsewhere classified: Secondary | ICD-10-CM

## 2016-11-16 DIAGNOSIS — Z789 Other specified health status: Secondary | ICD-10-CM

## 2017-01-09 ENCOUNTER — Ambulatory Visit
Admission: RE | Admit: 2017-01-09 | Discharge: 2017-01-09 | Disposition: A | Discharge status: Home / Self Care | Source: Ambulatory Visit | Attending: Gastroenterology | Admitting: Gastroenterology

## 2017-01-09 DIAGNOSIS — N289 Disorder of kidney and ureter, unspecified: Secondary | ICD-10-CM | POA: Insufficient documentation

## 2017-01-09 DIAGNOSIS — Z789 Other specified health status: Secondary | ICD-10-CM | POA: Diagnosis present

## 2017-01-09 DIAGNOSIS — K769 Liver disease, unspecified: Secondary | ICD-10-CM | POA: Diagnosis not present

## 2017-01-09 DIAGNOSIS — K760 Fatty (change of) liver, not elsewhere classified: Secondary | ICD-10-CM

## 2017-01-09 DIAGNOSIS — K8020 Calculus of gallbladder without cholecystitis without obstruction: Secondary | ICD-10-CM | POA: Insufficient documentation

## 2017-03-01 ENCOUNTER — Encounter: Payer: Self-pay | Admitting: Internal Medicine

## 2017-03-01 ENCOUNTER — Ambulatory Visit (INDEPENDENT_AMBULATORY_CARE_PROVIDER_SITE_OTHER): Admitting: Internal Medicine

## 2017-03-01 VITALS — BP 146/90 | HR 93 | Ht 76.0 in | Wt 264.0 lb

## 2017-03-01 DIAGNOSIS — I4819 Other persistent atrial fibrillation: Secondary | ICD-10-CM

## 2017-03-01 DIAGNOSIS — I481 Persistent atrial fibrillation: Secondary | ICD-10-CM | POA: Diagnosis not present

## 2017-03-01 NOTE — Patient Instructions (Signed)
Medication Instructions: - Your physician recommends that you continue on your current medications as directed. Please refer to the Current Medication list given to you today.  Labwork: - none ordered  Procedures/Testing: - none ordered  Follow-Up: - Your physician recommends that you schedule a follow-up appointment in: 3 months with Dr. Klein.   Any Additional Special Instructions Will Be Listed Below (If Applicable).     If you need a refill on your cardiac medications before your next appointment, please call your pharmacy.   

## 2017-03-01 NOTE — Progress Notes (Signed)
Other      ELECTROPHYSIOLOGY CONSULT NOTE  Patient ID: Tony Beck, MRN: [id number], DOB/AGE: November 23, 1956 61 y.o. Admit date: (Not on file) Date of Consult: 03/01/2017  Primary Physician: Kirk Ruths., MD Primary Cardiologist: Pascal Lux is being seen today for the evaluation of atrial fibrillation at the request of  Kirk Ruths, MD.   HPI Tony Beck is a 61 y.o. male  was recently diagnosed at the [place] with atrial fibrillation. He was asymptomatic at that time. He was started on apixoban. Echocardiogram 2/18 demonstrated normal left ventricular function with moderate left atrial enlargement (4.8)  He has a history of bradycardia. This was notable during exercise with peak heart rates in the 90-100 range. He had been seen by Dr. Irineo Axon for this. Carvedilol was maintained. He noted prior to the diagnosis of atrial fibrillation at peak heart rate for now on the 130-150 range.  He has  hypertension.  He has a history of prior thyroid ablation; TSH 2/18 5.6  Thromboembolic risk factors (, HTN-1) for a CHADSVASc Score of 1  He has a history of "borderline sleep apnea" which is not treated.   Past Medical History:  Diagnosis Date  . Abdominal hernia   . Arthritis   . COPD (chronic obstructive pulmonary disease) ([place])   . DVT (deep venous thrombosis) ([place])   . Hypertension   . Hypothyroidism   . Shingles       Surgical History:  Past Surgical History:  Procedure Laterality Date  . ARTHROSCOPY KNEE W/ DRILLING    . COLONOSCOPY    . COLONOSCOPY WITH PROPOFOL N/A 08/17/2015   Procedure: COLONOSCOPY WITH PROPOFOL;  Surgeon: Lollie Sails, MD;  Location[facility] ENDOSCOPY;  Service: Endoscopy;  Laterality: N/A;  . FLEXIBLE SIGMOIDOSCOPY N/A 09/03/2015   Procedure: FLEXIBLE SIGMOIDOSCOPY;  Surgeon: Lollie Sails, MD;  Location: [facility] ENDOSCOPY;  Service: Endoscopy;  Laterality: N/A;  . HERNIA REPAIR    . radioactive ablation thyroid       Home  Meds: Prior to Admission medications   Medication Sig Start Date End Date Taking? Authorizing Provider  amLODipine (NORVASC) 10 MG tablet Take 10 mg by mouth daily.   Yes Historical Provider, MD  apixaban (ELIQUIS) 5 MG TABS tablet Take 5 mg by mouth every 12 (twelve) hours.   Yes Historical Provider, MD  carvedilol (COREG) 12.5 MG tablet Take 12.5 mg by mouth 2 (two) times daily with a meal.   Yes Historical Provider, MD  cyanocobalamin 1000 MCG tablet Take 100 mcg by mouth daily.   Yes Historical Provider, MD  levothyroxine (SYNTHROID, LEVOTHROID) 125 MCG tablet Take 125 mcg by mouth daily before breakfast.   Yes Historical Provider, MD  naproxen (NAPROSYN) 500 MG tablet Take 500 mg by mouth 2 (two) times daily with a meal.   Yes Historical Provider, MD  pantoprazole (PROTONIX) 40 MG tablet Take 40 mg by mouth daily before lunch.   Yes Historical Provider, MD  traMADol (ULTRAM) 50 MG tablet Take 50 mg by mouth 2 (two) times daily as needed.   Yes Historical Provider, MD    Allergies:  Allergies  Allergen Reactions  . Codeine     Social History   Social History  . Marital status: Widowed    Spouse name: N/A  . Number of children: N/A  . Years of education: N/A   Occupational History  . Not on file.   Social History Main Topics  . Smoking status: Current Every Day Smoker  Packs/day: 0.50    Types: Cigarettes  . Smokeless tobacco: Never Used  . Alcohol use Yes  . Drug use: No  . Sexual activity: Not on file   Other Topics Concern  . Not on file   Social History Narrative  . No narrative on file     Family History  Problem Relation Age of Onset  . Heart disease Mother   . Heart attack Father      ROS:  Please see the history of present illness.     All other systems reviewed and negative.    Physical Exam: Blood pressure (!) 146/90, pulse 93, height 6\' 4"  (1.93 m), weight 264 lb (119.7 kg). General: Well developed, well nourished male in no acute  distress. Head: Normocephalic, atraumatic, sclera non-icteric, no xanthomas, nares are without discharge. EENT: normal  Lymph Nodes:  none Neck: Negative for carotid bruits. JVD not elevated. Back:without scoliosis kyphosis  Lungs: Clear bilaterally to auscultation without wheezes, rales, or rhonchi. Breathing is unlabored. Heart: Irregularly irregular rate and rhythm without murmur . No rubs, or gallops appreciated. Abdomen: Soft, non-tender, non-distended with normoactive bowel sounds. No hepatomegaly. No rebound/guarding. No obvious abdominal masses. Msk:  Strength and tone appear normal for age. Extremities: No clubbing or cyanosis. No edema.  Distal pedal pulses are 2+ and equal bilaterally. Skin: Warm and Dry Neuro: Alert and oriented X 3. CN III-XII intact Grossly normal sensory and motor function . Psych:  Responds to questions appropriately with a normal affect.      Labs: Cardiac Enzymes No results for input(s): CKTOTAL, CKMB, TROPONINI in the last 72 hours. CBC Lab Results  Component Value Date   PLT 109 (L) 08/17/2015   PROTIME: No results for input(s): LABPROT, INR in the last 72 hours. Chemistry No results for input(s): NA, K, CL, CO2, BUN, CREATININE, CALCIUM, PROT, BILITOT, ALKPHOS, ALT, AST, GLUCOSE in the last 168 hours.  Invalid input(s): LABALBU Lipids No results found for: CHOL, HDL, LDLCALC, TRIG BNP No results found for: PROBNP Thyroid Function Tests: No results for input(s): TSH, T4TOTAL, T3FREE, THYROIDAB in the last 72 hours.  Invalid input(s): FREET3 Miscellaneous No results found for: DDIMER  Radiology/Studies:  No results found.  EKG .Personally reviewed  : Atrial fibrillation at 73   Assessment and Plan:  Atrial fibrillation-persistent  Left atrial enlargement  Hypertension  Sleep apnea    The patient has persistent atrial fibrillation. He should be cardioverted. We discussed the likelihood that he will retain sinus rhythm on his  own probably in the 25% range.  Given his young age, I would have a low threshold of antiarrhythmic therapy not withstanding the paucity of symptoms and a relatively low threshold for referral for catheter ablation.  His thromboembolic risk profile is in a CHADS-VASc score 1. It is sufficiently low that I think following cardioversion in the absence of anticipated cardioversion or ablation that anticoagulation (and aspirin) are not necessary  However, his sleep apnea issue needs to be addressed. I would recommend a repeat sleep study.  Blood pressures in the office are high; at home he says systolic blood pressures in the 120s.  I have reached out to Dr. Helane Gunther;  he will reach out to the patient to arrange cardioversion  We will plan to see the patient in 3 months time  Virl Axe

## 2017-03-06 ENCOUNTER — Encounter: Payer: Self-pay | Admitting: Anesthesiology

## 2017-03-06 ENCOUNTER — Encounter
Admission: RE | Disposition: A | Discharge status: Home / Self Care | Payer: Self-pay | Source: Ambulatory Visit | Attending: Internal Medicine

## 2017-03-06 ENCOUNTER — Ambulatory Visit
Admission: RE | Admit: 2017-03-06 | Discharge: 2017-03-06 | Disposition: A | Discharge status: Home / Self Care | Source: Ambulatory Visit | Attending: Internal Medicine | Admitting: Internal Medicine

## 2017-03-06 ENCOUNTER — Ambulatory Visit: Admitting: Certified Registered Nurse Anesthetist

## 2017-03-06 DIAGNOSIS — E039 Hypothyroidism, unspecified: Secondary | ICD-10-CM | POA: Diagnosis not present

## 2017-03-06 DIAGNOSIS — F17210 Nicotine dependence, cigarettes, uncomplicated: Secondary | ICD-10-CM | POA: Diagnosis not present

## 2017-03-06 DIAGNOSIS — I480 Paroxysmal atrial fibrillation: Secondary | ICD-10-CM | POA: Diagnosis not present

## 2017-03-06 DIAGNOSIS — I10 Essential (primary) hypertension: Secondary | ICD-10-CM | POA: Diagnosis not present

## 2017-03-06 DIAGNOSIS — I481 Persistent atrial fibrillation: Secondary | ICD-10-CM | POA: Insufficient documentation

## 2017-03-06 DIAGNOSIS — I4891 Unspecified atrial fibrillation: Secondary | ICD-10-CM | POA: Diagnosis present

## 2017-03-06 DIAGNOSIS — Z7901 Long term (current) use of anticoagulants: Secondary | ICD-10-CM | POA: Diagnosis not present

## 2017-03-06 DIAGNOSIS — K219 Gastro-esophageal reflux disease without esophagitis: Secondary | ICD-10-CM | POA: Insufficient documentation

## 2017-03-06 DIAGNOSIS — Z79899 Other long term (current) drug therapy: Secondary | ICD-10-CM | POA: Insufficient documentation

## 2017-03-06 DIAGNOSIS — Z8249 Family history of ischemic heart disease and other diseases of the circulatory system: Secondary | ICD-10-CM | POA: Diagnosis not present

## 2017-03-06 DIAGNOSIS — Z86718 Personal history of other venous thrombosis and embolism: Secondary | ICD-10-CM | POA: Insufficient documentation

## 2017-03-06 DIAGNOSIS — J449 Chronic obstructive pulmonary disease, unspecified: Secondary | ICD-10-CM | POA: Diagnosis not present

## 2017-03-06 HISTORY — PX: CARDIOVERSION: EP1203

## 2017-03-06 SURGERY — CARDIOVERSION (CATH LAB)
Anesthesia: General

## 2017-03-06 MED ORDER — SODIUM CHLORIDE 0.9 % IV SOLN
INTRAVENOUS | Status: DC
Start: 2017-03-06 — End: 2017-03-06
  Administered 2017-03-06: 07:00:00 via INTRAVENOUS

## 2017-03-06 MED ORDER — PHENYLEPHRINE HCL 10 MG/ML IJ SOLN
INTRAMUSCULAR | Status: AC
  Filled 2017-03-06: qty 1

## 2017-03-06 MED ORDER — PROPOFOL 10 MG/ML IV BOLUS
INTRAVENOUS | Status: DC | PRN
  Administered 2017-03-06: 100 mg via INTRAVENOUS

## 2017-03-06 MED ORDER — PROPOFOL 10 MG/ML IV BOLUS
INTRAVENOUS | Status: AC
  Filled 2017-03-06: qty 20

## 2017-03-06 NOTE — Anesthesia Preprocedure Evaluation (Signed)
Anesthesia Evaluation  Patient identified by MRN, date of birth, ID band Patient awake    Reviewed: Allergy & Precautions, H&P , NPO status , Patient's Chart, lab work & pertinent test results  History of Anesthesia Complications Negative for: history of anesthetic complications  Airway Mallampati: III  TM Distance: >3 FB Neck ROM: full    Dental  (+) Poor Dentition, Chipped, Missing, Partial Upper   Pulmonary neg shortness of breath, COPD, Current Smoker,    Pulmonary exam normal breath sounds clear to auscultation       Cardiovascular Exercise Tolerance: Good hypertension, (-) angina(-) Past MI and (-) DOE Normal cardiovascular exam+ dysrhythmias Atrial Fibrillation  Rhythm:regular Rate:Normal     Neuro/Psych negative neurological ROS  negative psych ROS   GI/Hepatic negative GI ROS, Neg liver ROS, neg GERD  ,  Endo/Other  Hypothyroidism   Renal/GU negative Renal ROS  negative genitourinary   Musculoskeletal  (+) Arthritis ,   Abdominal   Peds  Hematology negative hematology ROS (+)   Anesthesia Other Findings Past Medical History: No date: Abdominal hernia No date: Arthritis No date: COPD (chronic obstructive pulmonary disease) (* No date: DVT (deep venous thrombosis) ([facility]) No date: Hypertension No date: Hypothyroidism No date: Shingles  Past Surgical History: No date: ARTHROSCOPY KNEE W/ DRILLING No date: COLONOSCOPY 08/17/2015: COLONOSCOPY WITH PROPOFOL N/A     Comment: Procedure: COLONOSCOPY WITH PROPOFOL;                Surgeon: Lollie Sails, MD;  Location: [facility]              ENDOSCOPY;  Service: Endoscopy;  Laterality:               N/A; 09/03/2015: FLEXIBLE SIGMOIDOSCOPY N/A     Comment: Procedure: FLEXIBLE SIGMOIDOSCOPY;  Surgeon:               Lollie Sails, MD;  Location: [facility]               ENDOSCOPY;  Service: Endoscopy;  Laterality:               N/A; No date: HERNIA REPAIR No  date: radioactive ablation thyroid     Reproductive/Obstetrics negative OB ROS                             Anesthesia Physical Anesthesia Plan  ASA: III  Anesthesia Plan: General   Post-op Pain Management:    Induction:   Airway Management Planned:   Additional Equipment:   Intra-op Plan:   Post-operative Plan:   Informed Consent: I have reviewed the patients History and Physical, chart, labs and discussed the procedure including the risks, benefits and alternatives for the proposed anesthesia with the patient or authorized representative who has indicated his/her understanding and acceptance.   Dental Advisory Given  Plan Discussed with: Anesthesiologist, CRNA and Surgeon  Anesthesia Plan Comments:         Anesthesia Quick Evaluation

## 2017-03-06 NOTE — Discharge Instructions (Signed)
Electrical Cardioversion, Care After °This sheet gives you information about how to care for yourself after your procedure. Your health care provider may also give you more specific instructions. If you have problems or questions, contact your health care provider. °What can I expect after the procedure? °After the procedure, it is common to have: °· Some redness on the skin where the shocks were given. °Follow these instructions at home: °· Do not drive for 24 hours if you were given a medicine to help you relax (sedative). °· Take over-the-counter and prescription medicines only as told by your health care provider. °· Ask your health care provider how to check your pulse. Check it often. °· Rest for 48 hours after the procedure or as told by your health care provider. °· Avoid or limit your caffeine use as told by your health care provider. °Contact a health care provider if: °· You feel like your heart is beating too quickly or your pulse is not regular. °· You have a serious muscle cramp that does not go away. °Get help right away if: °· You have discomfort in your chest. °· You are dizzy or you feel faint. °· You have trouble breathing or you are short of breath. °· Your speech is slurred. °· You have trouble moving an arm or leg on one side of your body. °· Your fingers or toes turn cold or blue. °This information is not intended to replace advice given to you by your health care provider. Make sure you discuss any questions you have with your health care provider. °Document Released: 09/03/2013 Document Revised: 06/16/2016 Document Reviewed: 05/19/2016 °Elsevier Interactive Patient Education © 2017 [employer] ° °

## 2017-03-06 NOTE — Anesthesia Post-op Follow-up Note (Cosign Needed)
Anesthesia QCDR form completed.        

## 2017-03-06 NOTE — CV Procedure (Signed)
Electrical Cardioversion Procedure Note Tony Beck [id number] [birth year]  Procedure: Electrical Cardioversion Indications:  Atrial Fibrillation  Procedure Details Consent: Risks of procedure as well as the alternatives and risks of each were explained to the (patient/caregiver).  Consent for procedure obtained. Time Out: Verified patient identification, verified procedure, site/side was marked, verified correct patient position, special equipment/implants available, medications/allergies/relevent history reviewed, required imaging and test results available.  Performed  Patient placed on cardiac monitor, pulse oximetry, supplemental oxygen as necessary.  Sedation given: Short-acting barbiturates Pacer pads placed anterior and posterior chest.  Cardioverted 3 time(s).  Cardioverted at 150J.  Evaluation Findings: Post procedure EKG shows: Atrial Fibrillation Complications: None Patient did tolerate procedure well.   Tony Beck 03/06/2017, 7:35 AM

## 2017-03-06 NOTE — Anesthesia Postprocedure Evaluation (Signed)
Anesthesia Post Note  Patient: Tony Beck  Procedure(s) Performed: Procedure(s) (LRB): CARDIOVERSION (N/A)  Patient location during evaluation: Cath Lab Anesthesia Type: General Level of consciousness: awake and alert Pain management: pain level controlled Vital Signs Assessment: post-procedure vital signs reviewed and stable Respiratory status: spontaneous breathing, nonlabored ventilation, respiratory function stable and patient connected to nasal cannula oxygen Cardiovascular status: blood pressure returned to baseline and stable Postop Assessment: no signs of nausea or vomiting Anesthetic complications: no     Last Vitals:  Vitals:   03/06/17 0800 03/06/17 0815  BP: (!) 164/140 (!) 131/99  Pulse: 88 89  Resp: 17 17  Temp:      Last Pain:  Vitals:   03/06/17 0648  TempSrc: Oral                 Precious Haws Piscitello

## 2017-03-06 NOTE — Transfer of Care (Signed)
Immediate Anesthesia Transfer of Care Note  Patient: Tony Beck  Procedure(s) Performed: Procedure(s): CARDIOVERSION (N/A)  Patient Location: PACU  Anesthesia Type:General  Level of Consciousness: awake and alert   Airway & Oxygen Therapy: Patient Spontanous Breathing and Patient connected to nasal cannula oxygen  Post-op Assessment: Report given to RN and Post -op Vital signs reviewed and stable  Post vital signs: Reviewed and stable  Last Vitals:  Vitals:   03/06/17 0728 03/06/17 0729  BP: (!) 133/120   Pulse: 91 97  Resp: (!) 23 (!) 22  Temp:      Last Pain:  Vitals:   03/06/17 0648  TempSrc: Oral         Complications: No apparent anesthesia complications

## 2017-03-06 NOTE — Anesthesia Procedure Notes (Signed)
Performed by: Demetrius Charity Pre-anesthesia Checklist: Patient identified, Emergency Drugs available, Timeout performed, Patient being monitored and Suction available Patient Re-evaluated:Patient Re-evaluated prior to inductionOxygen Delivery Method: Nasal cannula Intubation Type: IV induction

## 2017-03-13 IMAGING — US US ABDOMEN COMPLETE
1 series · 13 of 25 positions shown · non-contrast
Comparison: 07/06/2015 abdominal sonogram.  06/09/2014 MRI abdomen.

CLINICAL DATA: Hepatic steatosis.

EXAM:
ABDOMEN ULTRASOUND COMPLETE

[Series 1: us abdomen complete · 0.23mm/px · 13 of 97 slices shown]
[im 1/97]
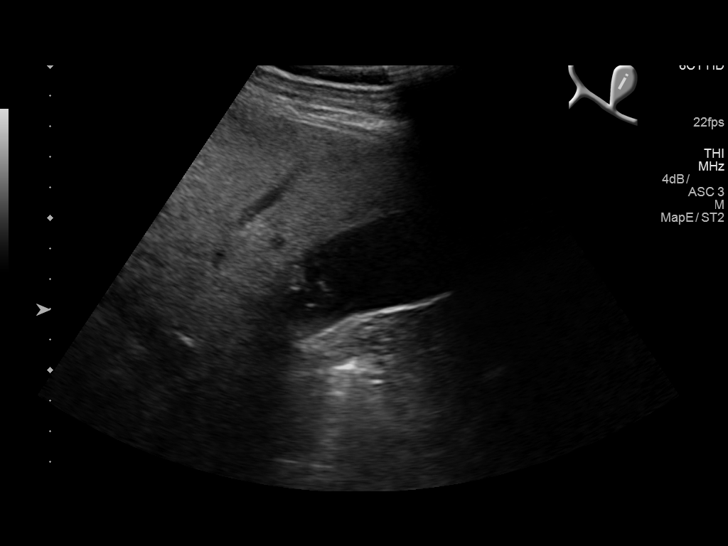
[im 9/97]
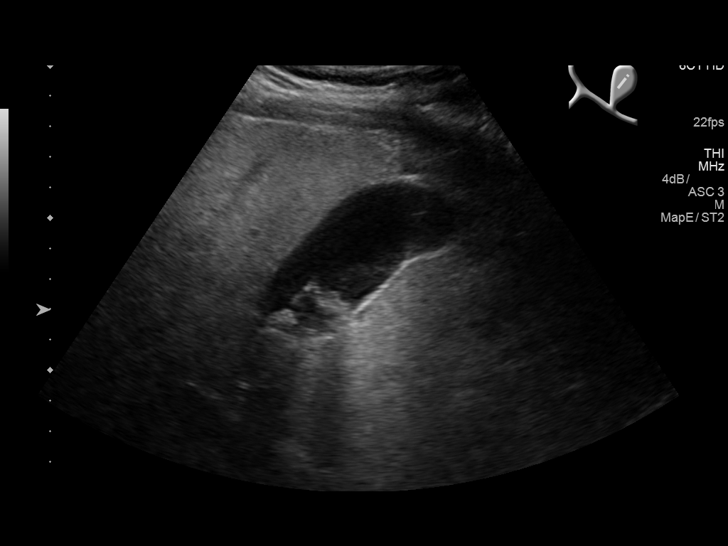
[im 17/97]
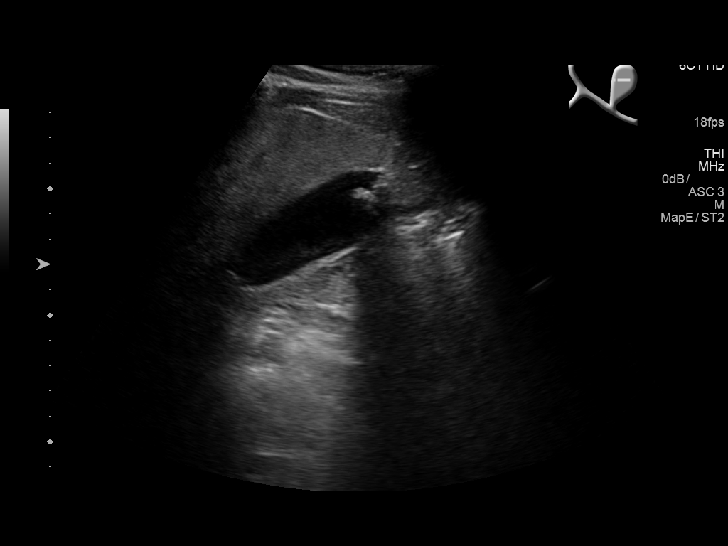
[im 25/97]
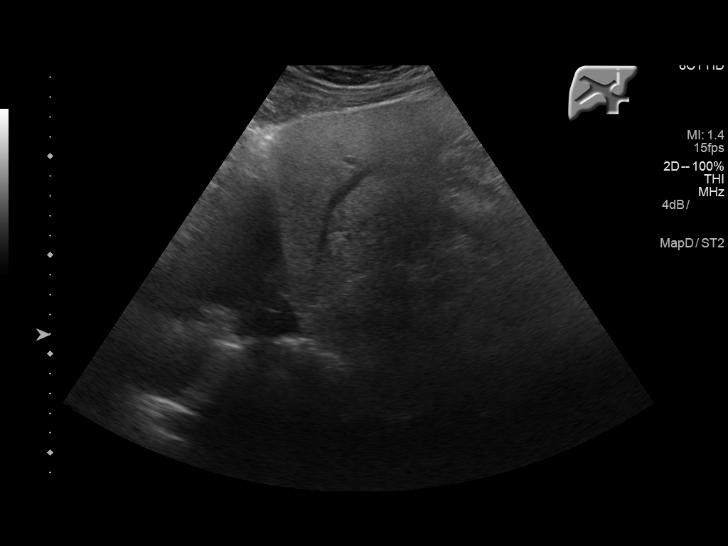
[im 33/97]
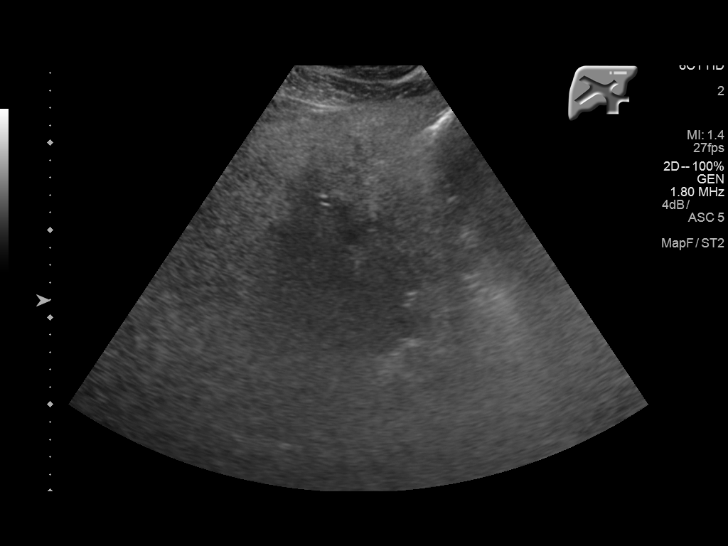
[im 41/97]
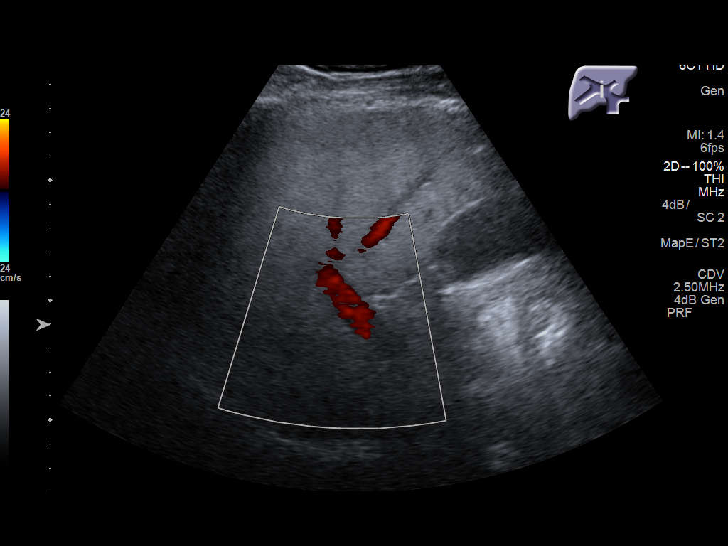
[im 49/97]
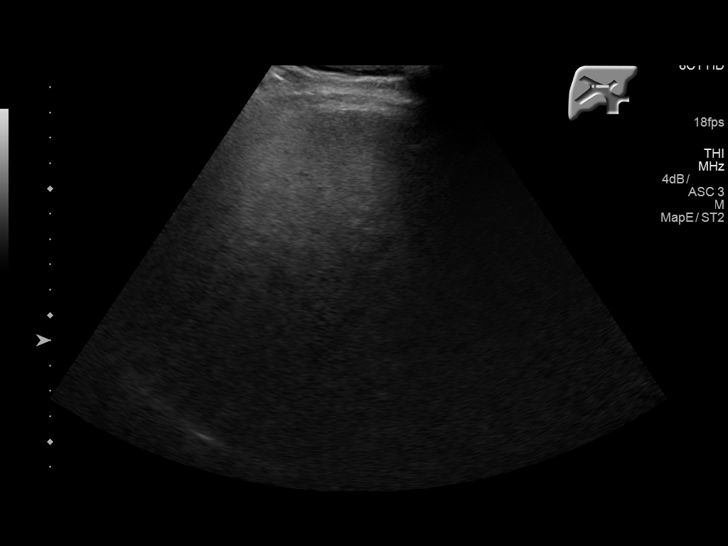
[im 57/97]
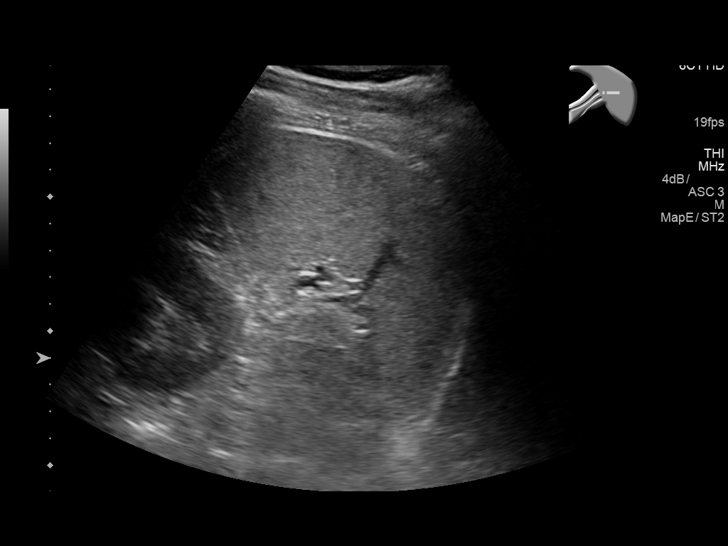
[im 65/97]
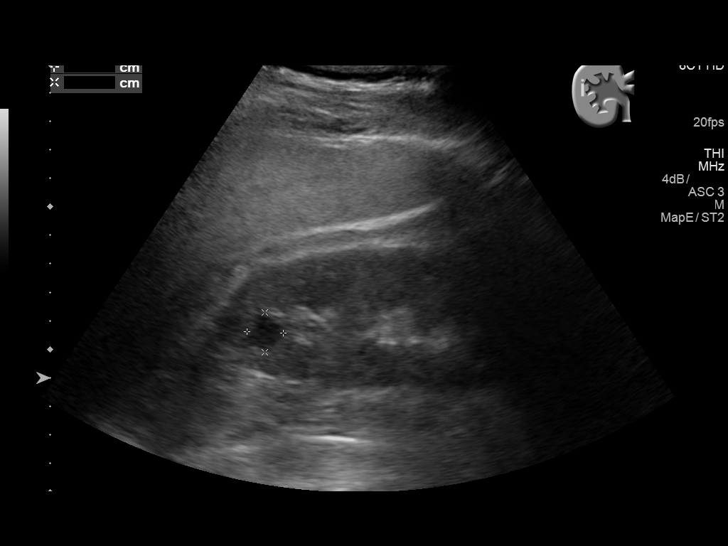
[im 73/97]
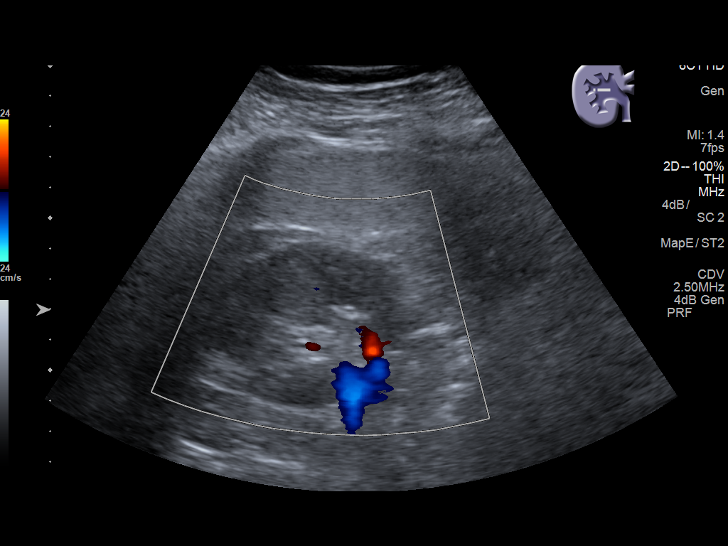
[im 81/97]
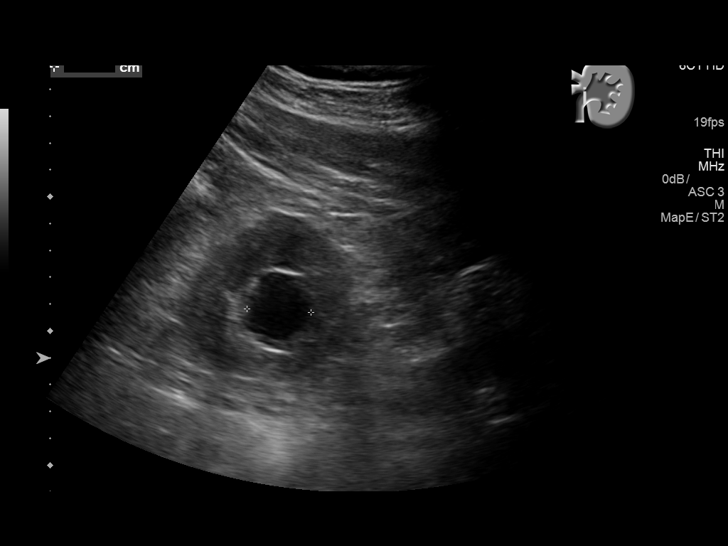
[im 89/97]
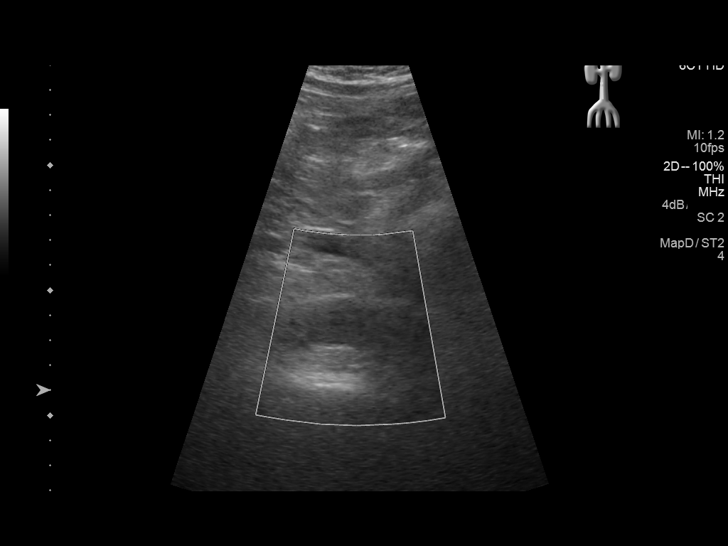
[im 97/97]
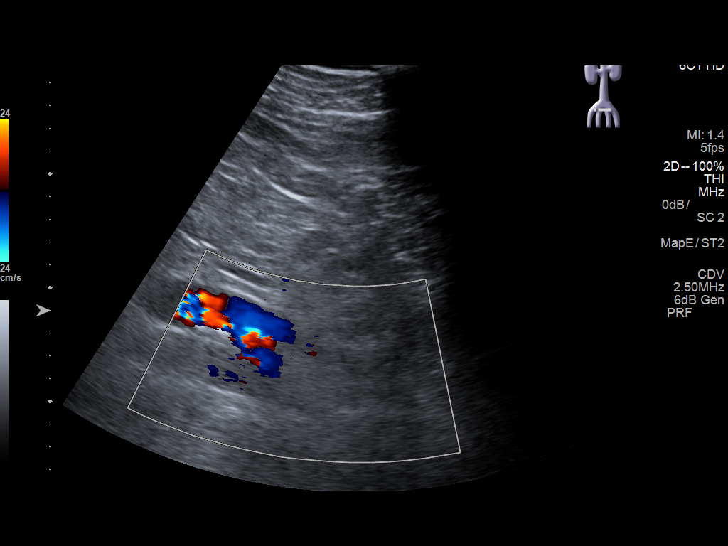

[13 of 25 positions shown; findings below may reference images not displayed]

FINDINGS: Gallbladder: Nondistended gallbladder contains several layering
calcified shadowing gallstones measuring up to 2.0 cm in size, with
no gallbladder wall thickening, pericholecystic fluid or sonographic
Murphy's sign.

Common bile duct: Diameter: 3 mm

Liver: Liver parenchyma is diffusely markedly echogenic with
prominent posterior acoustic attenuation, in keeping with severe
diffuse hepatic steatosis. No liver mass is demonstrated, noting
significantly decreased sensitivity in the setting of an echogenic
liver. No definite liver surface irregularity is demonstrated.

IVC: No abnormality visualized.

Pancreas: Not well visualized due to overlying bowel gas and patient
body habitus.

Spleen: Size and appearance within normal limits.

Right Kidney: Length: 13.1 cm. Normal echogenicity. No
hydronephrosis. Simple appearing 1.3 x 1.4 x 1.5 cm upper right
renal cyst. Limited visualization of a mildly complex 1.3 x 1.2 x
1.3 cm renal cyst in the lower right kidney with thin internal
septation, previously 1.0 x 1.1 x 1.2 cm, not appreciably changed,
and likely correlating with a Bosniak category 2 renal cyst in the
lower right kidney on the 06/09/2014 MRI.

Left Kidney: Length: 12.4 cm. Normal echogenicity. No
hydronephrosis. Minimally complex 2.6 x 2.9 x 2.4 cm renal cyst in
the upper left renal sinus with thin internal septation, previously
2.3 x 2.5 x 2.3 cm, mildly increased in size.

Abdominal aorta: No aneurysm visualized.

Other findings: None.
IMPRESSION: 1. Severe diffuse hepatic steatosis. No macroscopic evidence of
cirrhosis. No liver mass detected.
2. Cholelithiasis. No evidence of acute cholecystitis. No biliary
ductal dilatation.
3. Benign-appearing bilateral renal cysts.  No hydronephrosis.

## 2017-05-10 ENCOUNTER — Encounter
Admission: RE | Disposition: A | Discharge status: Home / Self Care | Payer: Self-pay | Source: Ambulatory Visit | Attending: Internal Medicine

## 2017-05-10 ENCOUNTER — Ambulatory Visit: Admitting: Anesthesiology

## 2017-05-10 ENCOUNTER — Encounter: Payer: Self-pay | Admitting: *Deleted

## 2017-05-10 ENCOUNTER — Ambulatory Visit
Admission: RE | Admit: 2017-05-10 | Discharge: 2017-05-10 | Disposition: A | Discharge status: Home / Self Care | Source: Ambulatory Visit | Attending: Internal Medicine | Admitting: Internal Medicine

## 2017-05-10 DIAGNOSIS — Z79891 Long term (current) use of opiate analgesic: Secondary | ICD-10-CM | POA: Insufficient documentation

## 2017-05-10 DIAGNOSIS — I481 Persistent atrial fibrillation: Secondary | ICD-10-CM | POA: Insufficient documentation

## 2017-05-10 DIAGNOSIS — G4730 Sleep apnea, unspecified: Secondary | ICD-10-CM | POA: Insufficient documentation

## 2017-05-10 DIAGNOSIS — F17210 Nicotine dependence, cigarettes, uncomplicated: Secondary | ICD-10-CM | POA: Diagnosis not present

## 2017-05-10 DIAGNOSIS — I10 Essential (primary) hypertension: Secondary | ICD-10-CM | POA: Insufficient documentation

## 2017-05-10 DIAGNOSIS — J449 Chronic obstructive pulmonary disease, unspecified: Secondary | ICD-10-CM | POA: Diagnosis not present

## 2017-05-10 DIAGNOSIS — Z86718 Personal history of other venous thrombosis and embolism: Secondary | ICD-10-CM | POA: Diagnosis not present

## 2017-05-10 DIAGNOSIS — Z79899 Other long term (current) drug therapy: Secondary | ICD-10-CM | POA: Diagnosis not present

## 2017-05-10 DIAGNOSIS — Z7901 Long term (current) use of anticoagulants: Secondary | ICD-10-CM | POA: Diagnosis not present

## 2017-05-10 DIAGNOSIS — I480 Paroxysmal atrial fibrillation: Secondary | ICD-10-CM | POA: Diagnosis present

## 2017-05-10 DIAGNOSIS — Z89421 Acquired absence of other right toe(s): Secondary | ICD-10-CM | POA: Diagnosis not present

## 2017-05-10 DIAGNOSIS — E890 Postprocedural hypothyroidism: Secondary | ICD-10-CM | POA: Diagnosis not present

## 2017-05-10 HISTORY — PX: CARDIOVERSION: EP1203

## 2017-05-10 LAB — BASIC METABOLIC PANEL
Anion gap: 7 (ref 5–15)
BUN: 14 mg/dL (ref 6–20)
CHLORIDE: 106 mmol/L (ref 101–111)
CO2: 23 mmol/L (ref 22–32)
Calcium: 8.9 mg/dL (ref 8.9–10.3)
Creatinine, Ser: 1.03 mg/dL (ref 0.61–1.24)
GFR calc Af Amer: >60 mL/min (ref >60)
GLUCOSE: 101 mg/dL — AB (ref 65–99)
POTASSIUM: 3.6 mmol/L (ref 3.5–5.1)
Sodium: 136 mmol/L (ref 135–145)

## 2017-05-10 SURGERY — CARDIOVERSION (CATH LAB)
Anesthesia: General

## 2017-05-10 MED ORDER — PROPOFOL 10 MG/ML IV BOLUS
INTRAVENOUS | Status: DC | PRN
  Administered 2017-05-10: 20 mg via INTRAVENOUS
  Administered 2017-05-10: 50 mg via INTRAVENOUS
  Administered 2017-05-10: 30 mg via INTRAVENOUS

## 2017-05-10 MED ORDER — SODIUM CHLORIDE 0.9 % IV SOLN
INTRAVENOUS | Status: DC
  Administered 2017-05-10: 08:00:00 via INTRAVENOUS

## 2017-05-10 MED ORDER — FENTANYL CITRATE (PF) 100 MCG/2ML IJ SOLN: 25.0000 ug | INTRAMUSCULAR | Status: DC | PRN

## 2017-05-10 MED ORDER — PHENYLEPHRINE HCL 10 MG/ML IJ SOLN
INTRAMUSCULAR | Status: AC
  Filled 2017-05-10: qty 1

## 2017-05-10 MED ORDER — MIDAZOLAM HCL 2 MG/2ML IJ SOLN
INTRAMUSCULAR | Status: DC | PRN
  Administered 2017-05-10: 1 mg via INTRAVENOUS

## 2017-05-10 MED ORDER — SODIUM CHLORIDE 0.9 % IJ SOLN
INTRAMUSCULAR | Status: AC
  Filled 2017-05-10: qty 10

## 2017-05-10 MED ORDER — ONDANSETRON HCL 4 MG/2ML IJ SOLN: 4.0000 mg | Freq: Once | INTRAMUSCULAR | Status: DC | PRN

## 2017-05-10 MED ORDER — EPHEDRINE SULFATE 50 MG/ML IJ SOLN
INTRAMUSCULAR | Status: AC
  Filled 2017-05-10: qty 1

## 2017-05-10 MED ORDER — PROPOFOL 10 MG/ML IV BOLUS
INTRAVENOUS | Status: AC
Start: 2017-05-10 — End: 2017-05-10
  Filled 2017-05-10: qty 40

## 2017-05-10 MED ORDER — MIDAZOLAM HCL 2 MG/2ML IJ SOLN
INTRAMUSCULAR | Status: AC
  Filled 2017-05-10: qty 2

## 2017-05-10 MED ORDER — SUCCINYLCHOLINE CHLORIDE 20 MG/ML IJ SOLN
INTRAMUSCULAR | Status: AC
  Filled 2017-05-10: qty 1

## 2017-05-10 NOTE — Anesthesia Post-op Follow-up Note (Cosign Needed)
Anesthesia QCDR form completed.        

## 2017-05-10 NOTE — Anesthesia Procedure Notes (Signed)
Date/Time: 05/10/2017 7:47 AM Performed by: Darlyne Russian Pre-anesthesia Checklist: Patient identified, Emergency Drugs available, Suction available, Patient being monitored and Timeout performed Patient Re-evaluated:Patient Re-evaluated prior to inductionOxygen Delivery Method: Nasal cannula Intubation Type: IV induction Placement Confirmation: positive ETCO2

## 2017-05-10 NOTE — CV Procedure (Signed)
Electrical Cardioversion Procedure Note Tony Beck [id number] 1956/02/14  Procedure: Electrical Cardioversion Indications:  Atrial Fibrillation  Procedure Details Consent: Risks of procedure as well as the alternatives and risks of each were explained to the (patient/caregiver).  Consent for procedure obtained. Time Out: Verified patient identification, verified procedure, site/side was marked, verified correct patient position, special equipment/implants available, medications/allergies/relevent history reviewed, required imaging and test results available.  Performed  Patient placed on cardiac monitor, pulse oximetry, supplemental oxygen as necessary.  Sedation given: Benzodiazepines and Short-acting barbiturates Pacer pads placed anterior and posterior chest.  Cardioverted 1 time(s).  Cardioverted at 150J.  Evaluation Findings: Post procedure EKG shows: NSR Complications: None Patient did tolerate procedure well.   Tony Beck 05/10/2017, 8:12 AM

## 2017-05-10 NOTE — Anesthesia Preprocedure Evaluation (Signed)
Anesthesia Evaluation  Patient identified by MRN, date of birth, ID band Patient awake    Reviewed: Allergy & Precautions, H&P , NPO status , Patient's Chart, lab work & pertinent test results, reviewed documented beta blocker date and time   History of Anesthesia Complications Negative for: history of anesthetic complications  Airway Mallampati: III  TM Distance: >3 FB Neck ROM: full    Dental  (+) Poor Dentition, Chipped, Missing, Partial Upper   Pulmonary neg shortness of breath, COPD, Current Smoker,    Pulmonary exam normal breath sounds clear to auscultation       Cardiovascular Exercise Tolerance: Good hypertension, Pt. on medications and Pt. on home beta blockers (-) angina(-) Past MI and (-) DOE Normal cardiovascular exam+ dysrhythmias Atrial Fibrillation  Rhythm:regular Rate:Normal     Neuro/Psych negative neurological ROS  negative psych ROS   GI/Hepatic negative GI ROS, Neg liver ROS, neg GERD  ,  Endo/Other  Hypothyroidism   Renal/GU negative Renal ROS  negative genitourinary   Musculoskeletal  (+) Arthritis ,   Abdominal   Peds negative pediatric ROS (+)  Hematology negative hematology ROS (+)   Anesthesia Other Findings Past Medical History: No date: Abdominal hernia No date: Arthritis No date: COPD (chronic obstructive pulmonary disease) (* No date: DVT (deep venous thrombosis) ([facility]) No date: Hypertension No date: Hypothyroidism No date: Shingles  Past Surgical History: No date: ARTHROSCOPY KNEE W/ DRILLING No date: COLONOSCOPY 08/17/2015: COLONOSCOPY WITH PROPOFOL N/A     Comment: Procedure: COLONOSCOPY WITH PROPOFOL;                Surgeon: Lollie Sails, MD;  Location: [facility]              ENDOSCOPY;  Service: Endoscopy;  Laterality:               N/A; 09/03/2015: FLEXIBLE SIGMOIDOSCOPY N/A     Comment: Procedure: FLEXIBLE SIGMOIDOSCOPY;  Surgeon:               Lollie Sails, MD;   [facility]               ENDOSCOPY;  Service: Endoscopy;  Laterality:               N/A; No date: HERNIA REPAIR No date: radioactive ablation thyroid     Reproductive/Obstetrics negative OB ROS                             Anesthesia Physical  Anesthesia Plan  ASA: III  Anesthesia Plan: General   Post-op Pain Management:    Induction: Intravenous  PONV Risk Score and Plan: 0  Airway Management Planned: Nasal Cannula  Additional Equipment:   Intra-op Plan:   Post-operative Plan:   Informed Consent: I have reviewed the patients History and Physical, chart, labs and discussed the procedure including the risks, benefits and alternatives for the proposed anesthesia with the patient or authorized representative who has indicated his/her understanding and acceptance.   Dental Advisory Given  Plan Discussed with: Anesthesiologist, CRNA and Surgeon  Anesthesia Plan Comments:         Anesthesia Quick Evaluation

## 2017-05-10 NOTE — Transfer of Care (Signed)
Immediate Anesthesia Transfer of Care Note  Patient: Tony Beck  Procedure(s) Performed: Procedure(s): CARDIOVERSION (N/A)  Patient Location: PACU  Anesthesia Type:General  Level of Consciousness: awake, alert , oriented and patient cooperative  Airway & Oxygen Therapy: Patient Spontanous Breathing and Patient connected to nasal cannula oxygen  Post-op Assessment: Report given to RN and Post -op Vital signs reviewed and stable  Post vital signs: Reviewed and stable  Last Vitals:  Vitals:   05/10/17 0755 05/10/17 0756  BP:  130/85  Pulse: 65 64  Resp: 16 18  Temp:      Last Pain:  Vitals:   05/10/17 0642  TempSrc: Oral  PainSc: 6       Patients Stated Pain Goal: 0 (95/07/22 5750)  Complications: No apparent anesthesia complications

## 2017-05-11 ENCOUNTER — Encounter: Payer: Self-pay | Admitting: Internal Medicine

## 2017-05-11 NOTE — Anesthesia Postprocedure Evaluation (Signed)
Anesthesia Post Note  Patient: Tony Beck  Procedure(s) Performed: Procedure(s) (LRB): CARDIOVERSION (N/A)  Patient location during evaluation: PACU Anesthesia Type: General Level of consciousness: awake and alert and oriented Pain management: pain level controlled Vital Signs Assessment: post-procedure vital signs reviewed and stable Respiratory status: spontaneous breathing Cardiovascular status: blood pressure returned to baseline Anesthetic complications: no     Last Vitals:  Vitals:   05/10/17 0815 05/10/17 0845  BP: (!) 147/123 (!) 152/96  Pulse: 63   Resp: 18 16  Temp:      Last Pain:  Vitals:   05/10/17 0642  TempSrc: Oral  PainSc: 6                  [name]

## 2017-09-18 IMAGING — US US ABDOMEN COMPLETE W/ ELASTOGRAPHY
1 series · 12 of 25 positions shown · non-contrast
Comparison: MRCP dated 06/09/2014 and abdominal ultrasound from
07/04/2016

CLINICAL DATA: Fatty liver.



[Series 1: us abdomen complete w/ elastography · 0.20mm/px · 12 of 128 slices shown]
[im 6/128]
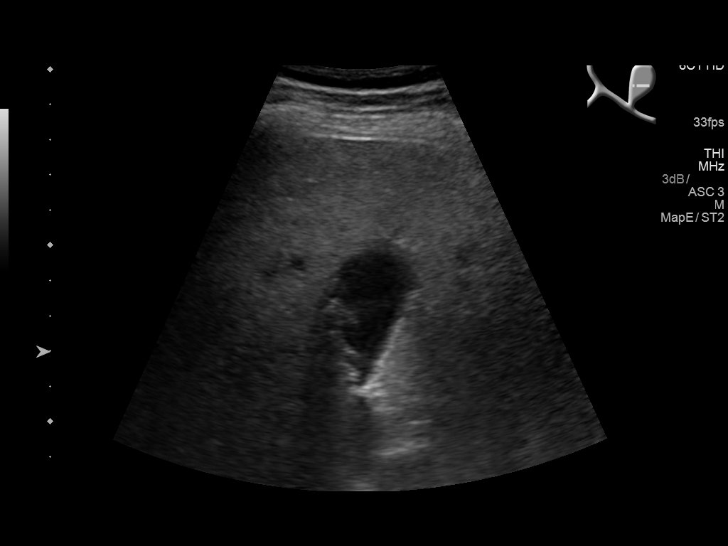
[im 16/128]
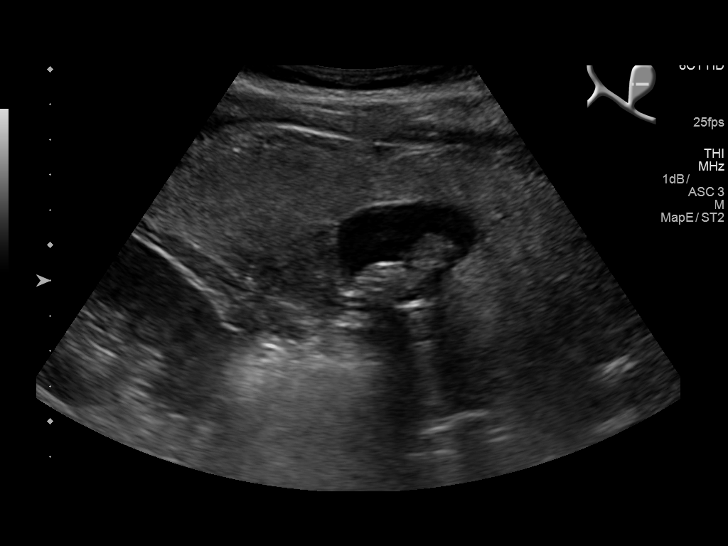
[im 27/128]
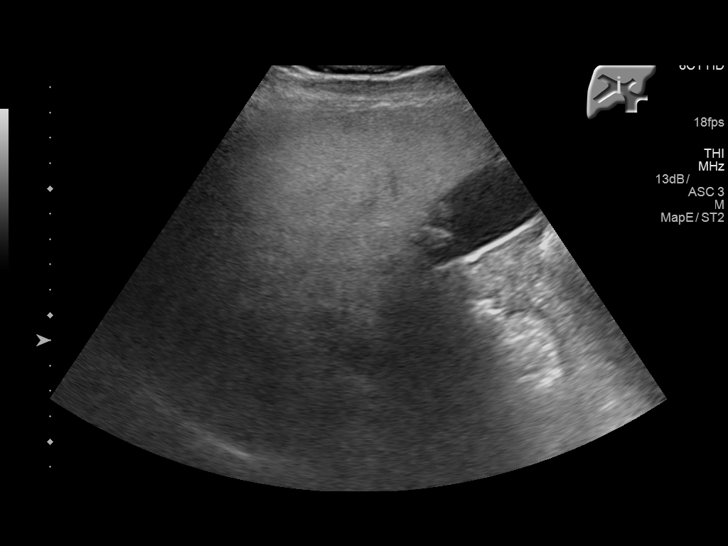
[im 38/128]
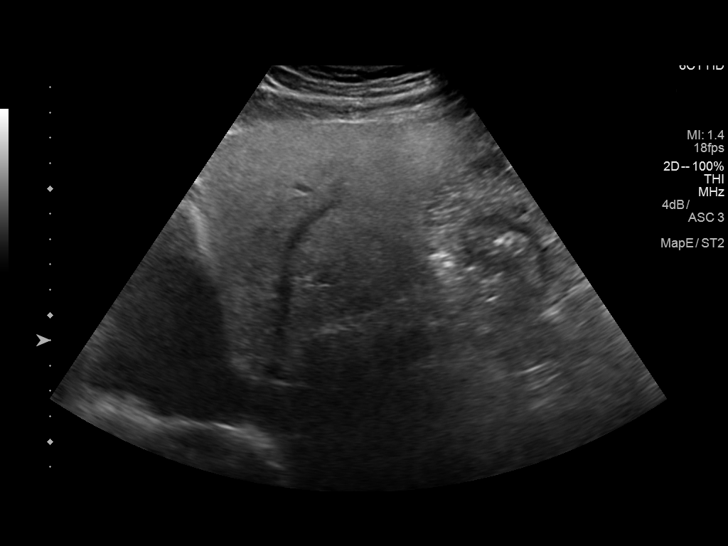
[im 48/128]
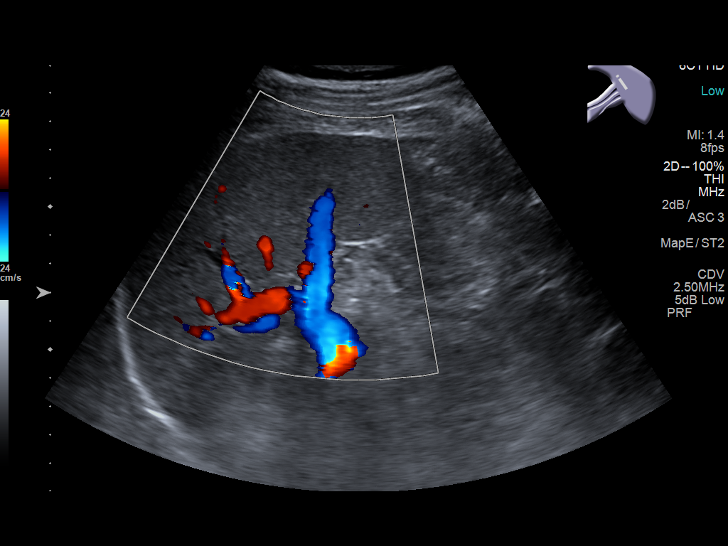
[im 59/128]
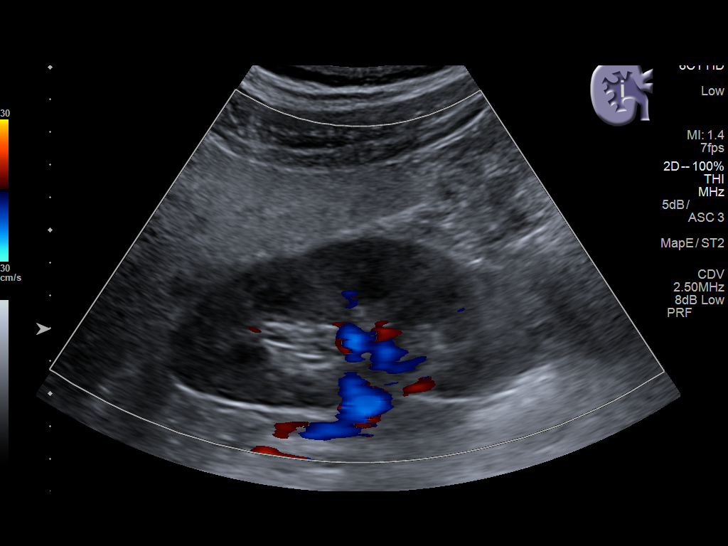
[im 69/128]
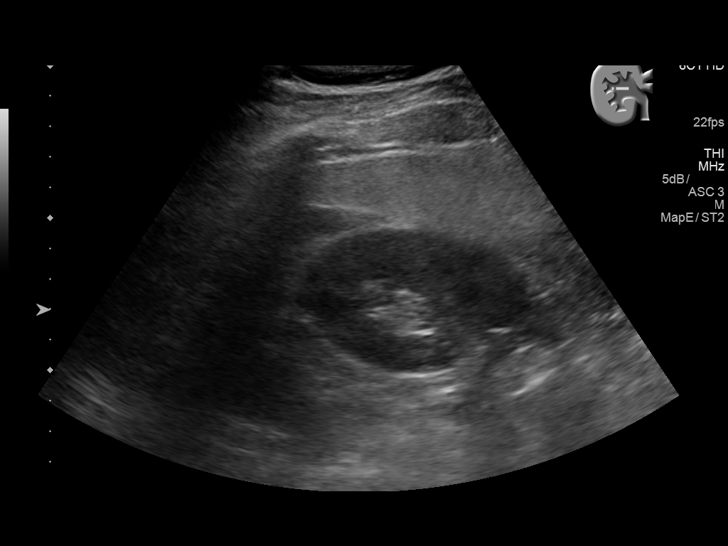
[im 80/128]
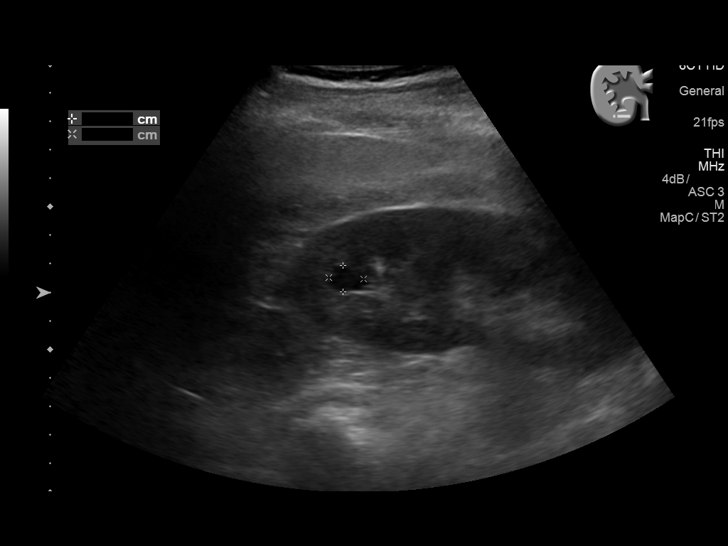
[im 90/128]
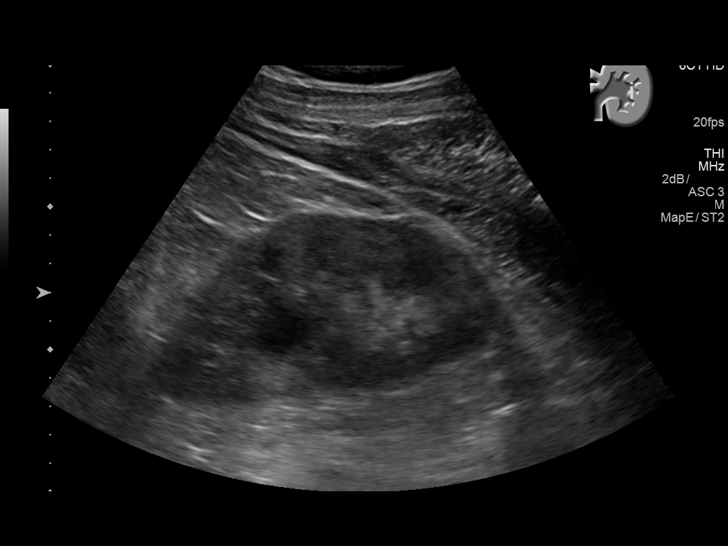
[im 101/128]
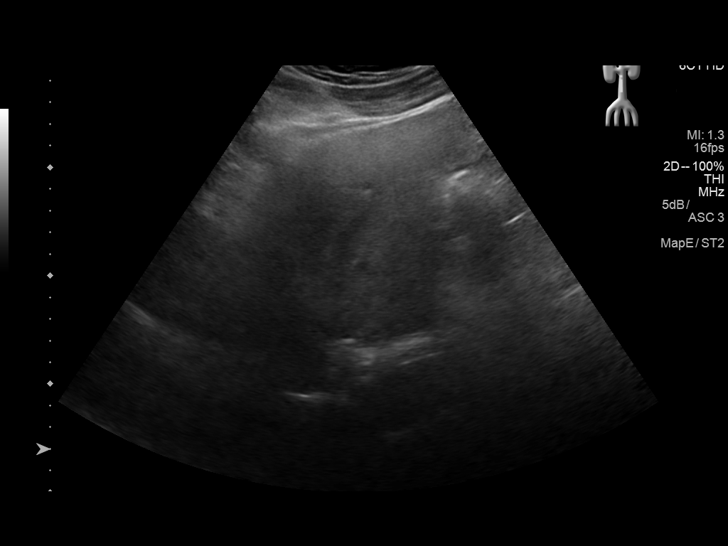
[im 112/128]
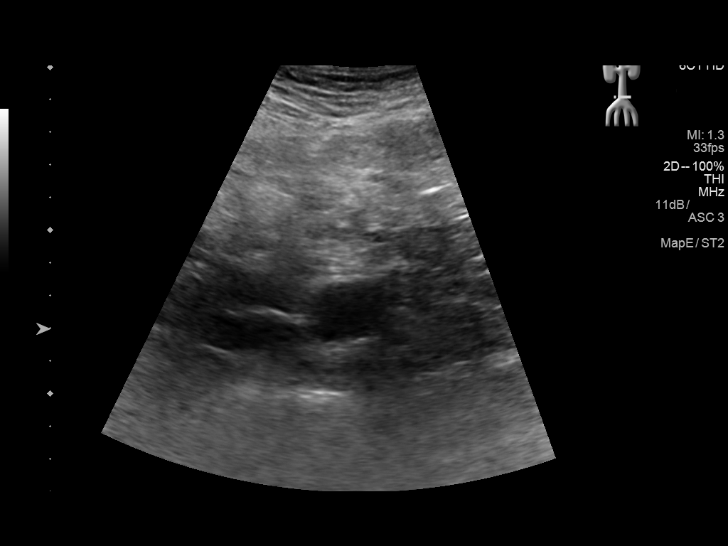
[im 122/128]
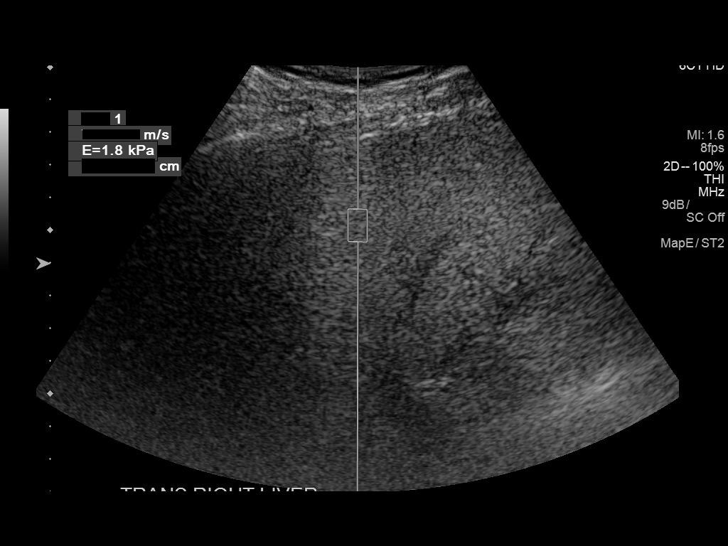

[12 of 25 positions shown; findings below may reference images not displayed]

FINDINGS: ULTRASOUND ABDOMEN

Gallbladder: Multiple mobile gallstones measuring up to 1.6 cm in
diameter. No gallbladder wall thickening or pericholecystic fluid.

Common bile duct: Diameter: 4 mm. No directly visualized
choledocholithiasis.

Liver: Coarse echogenic liver with poor sonic penetration compatible
with diffuse hepatic steatosis. No focal lesions seen.

IVC: No abnormality visualized.

Pancreas: Not seen due to overlying bowel gas.

Spleen: Upper normal size, volume 430 cc.

Right Kidney: Length: 12.8 cm. Several complex cystic renal lesions
include septations, for example a 1.2 by 0.9 by 1.6 cm upper pole
lesion and a 1.2 by 1.3 by 1.3 cm lower pole lesion.

Left Kidney: Length: 12.6 cm. Mildly complex 2.7 by 3.1 by 3.0 cm
left mid kidney lesion, with internal echoes, this did not have
abnormal enhancement back on the prior MRI from 06/09/2014.

Abdominal aorta: No aneurysm visualized.

Other findings: None.

ULTRASOUND HEPATIC ELASTOGRAPHY

Device: Siemens Helix VTQ

Patient position: Supine

Transducer [id number]

Number of measurements: 10

Hepatic segment:  8

Median velocity:   0.65  m/sec

IQR:

IQR/Median velocity ratio:

Corresponding Metavir fibrosis score:  F0/F1

Risk of fibrosis: Minimal

Limitations of exam: None

Pertinent findings noted on other imaging exams:  None

Please note that abnormal shear wave velocities may also be
identified in clinical settings other than with hepatic fibrosis,
such as: acute hepatitis, elevated right heart and central venous
pressures including use of beta blockers, Locklear disease
(Joshjax), infiltrative processes such as
mastocytosis/amyloidosis/infiltrative tumor, extrahepatic
cholestasis, in the post-prandial state, and liver transplantation.
Correlation with patient history, laboratory data, and clinical
condition recommended.
IMPRESSION: ULTRASOUND ABDOMEN:

1. Cholelithiasis without gallbladder wall thickening or directly
visualized choledocholithiasis.
2. There are two complex cystic right renal lesions which appear
larger than on the prior MRI exam. These are complex with internal
septations and at least Bosniak category 2, although a true Bosniak
categorization cannot be applied by ultrasound. Renal protocol MRI
with and without contrast to further characterize.
3. A left renal lesion is complex but probably benign.
4. Coarse echogenic liver with poor sonic penetration compatible
with diffuse hepatic steatosis.
5. Pancreas not well seen due to overlying bowel gas.

ULTRASOUND HEPATIC ELASTOGRAPHY:

Median hepatic shear wave velocity is calculated at 0.65 m/sec.

Corresponding Metavir fibrosis score is  F0/F1 .

Risk of fibrosis is minimal.

Follow-up: None required for the liver

## 2018-01-09 ENCOUNTER — Ambulatory Visit

## 2020-10-15 ENCOUNTER — Other Ambulatory Visit: Payer: Self-pay | Admitting: Gastroenterology

## 2020-10-15 DIAGNOSIS — D696 Thrombocytopenia, unspecified: Secondary | ICD-10-CM

## 2020-10-15 DIAGNOSIS — K760 Fatty (change of) liver, not elsewhere classified: Secondary | ICD-10-CM

## 2020-11-05 ENCOUNTER — Other Ambulatory Visit: Payer: Self-pay

## 2020-11-05 ENCOUNTER — Ambulatory Visit
Admission: RE | Admit: 2020-11-05 | Discharge: 2020-11-05 | Disposition: A | Discharge status: Home / Self Care | Payer: Medicare Other | Source: Ambulatory Visit | Attending: Gastroenterology | Admitting: Gastroenterology

## 2020-11-05 DIAGNOSIS — D696 Thrombocytopenia, unspecified: Secondary | ICD-10-CM | POA: Diagnosis present

## 2020-11-05 DIAGNOSIS — K760 Fatty (change of) liver, not elsewhere classified: Secondary | ICD-10-CM | POA: Diagnosis present

## 2021-01-10 ENCOUNTER — Telehealth: Payer: Self-pay | Admitting: Internal Medicine

## 2021-01-10 NOTE — Telephone Encounter (Signed)
Called old recall,  Patient states he now sees Dr. Gigi Gin. Recall will be deleted.

## 2021-12-15 IMAGING — US US ABDOMEN LIMITED
1 series · 15 of 25 positions shown · non-contrast
Comparison: 01/09/2017

CLINICAL DATA: Alcoholic fatty liver.

EXAM:
ULTRASOUND ABDOMEN LIMITED RIGHT UPPER QUADRANT

[Series 1: us abdomen limited ruq · 15 of 42 slices shown]
[im 1/42]
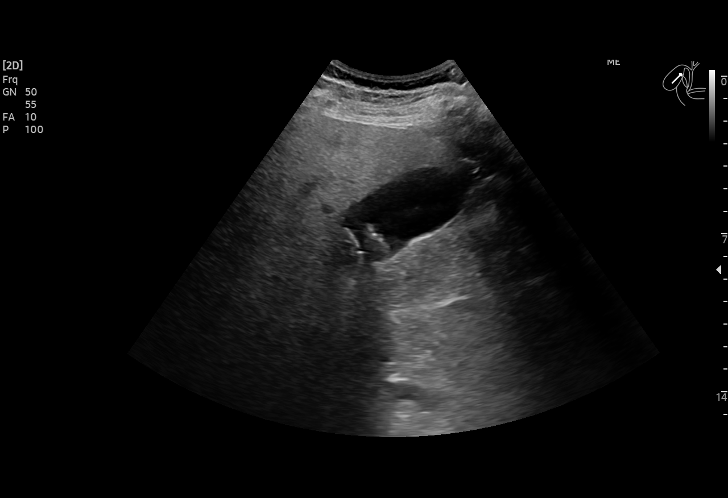
[im 4/42]
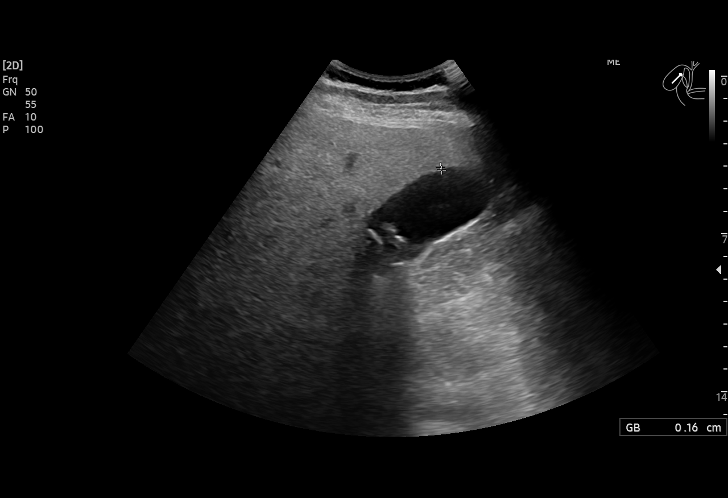
[im 7/42]
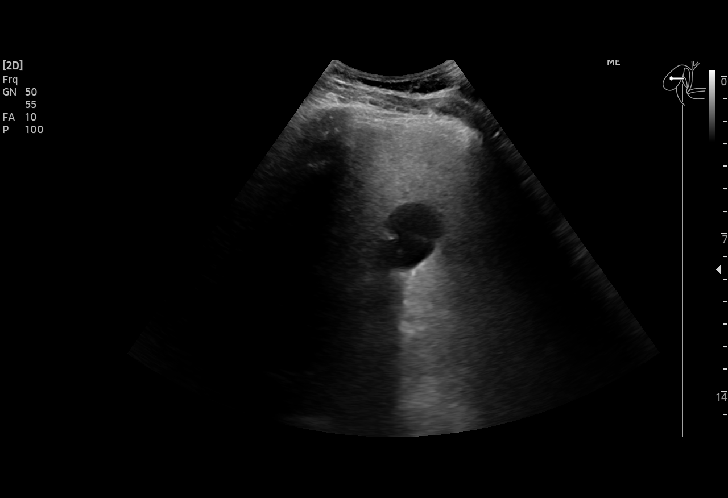
[im 9/42]
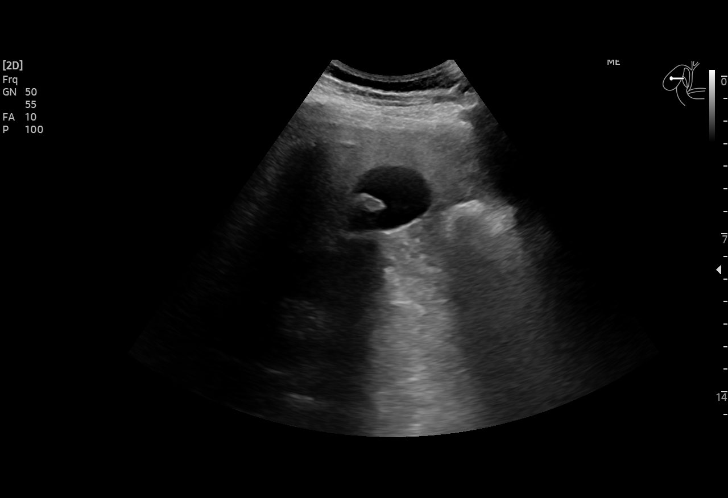
[im 12/42]
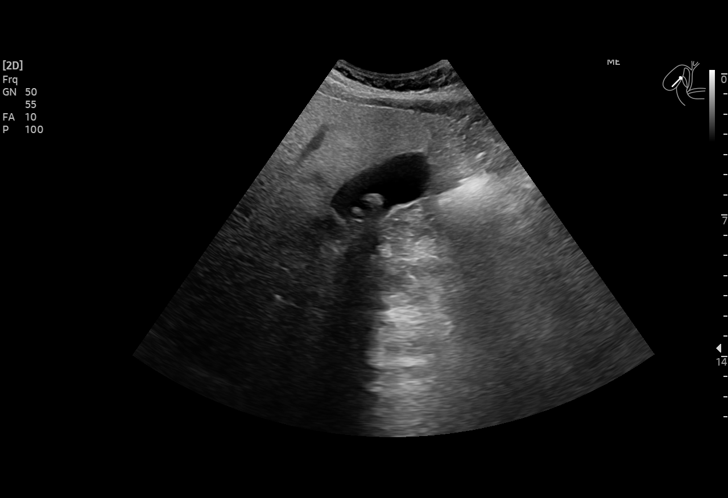
[im 16/42]
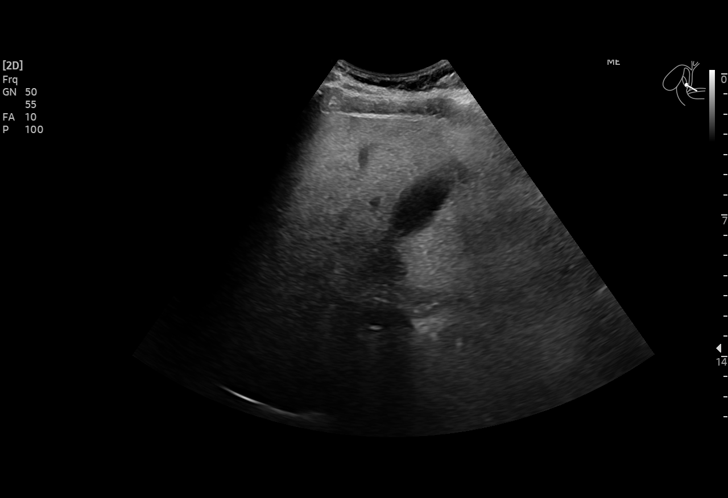
[im 18/42]
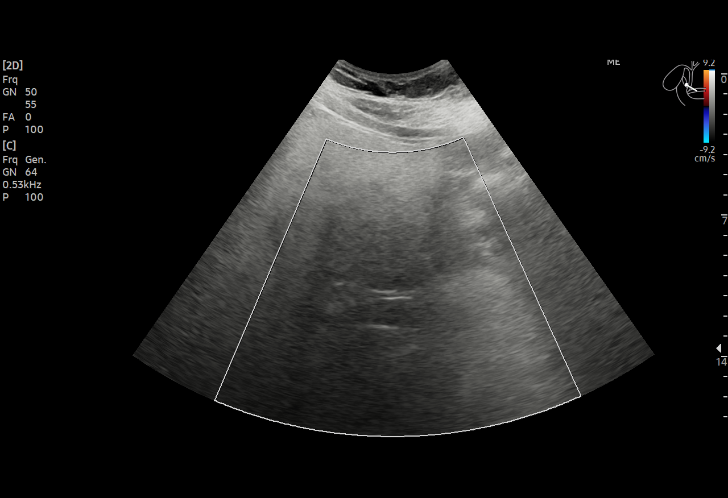
[im 21/42]
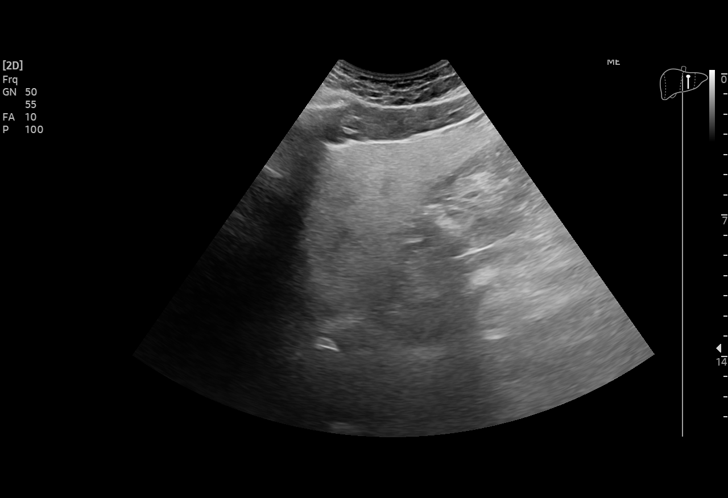
[im 24/42]
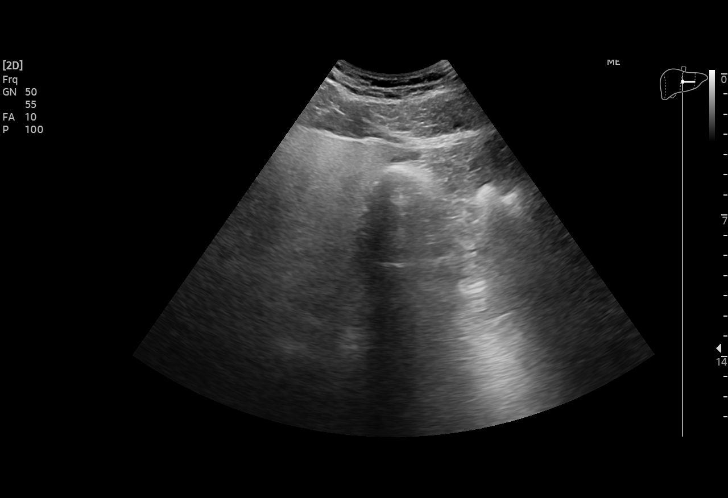
[im 26/42]
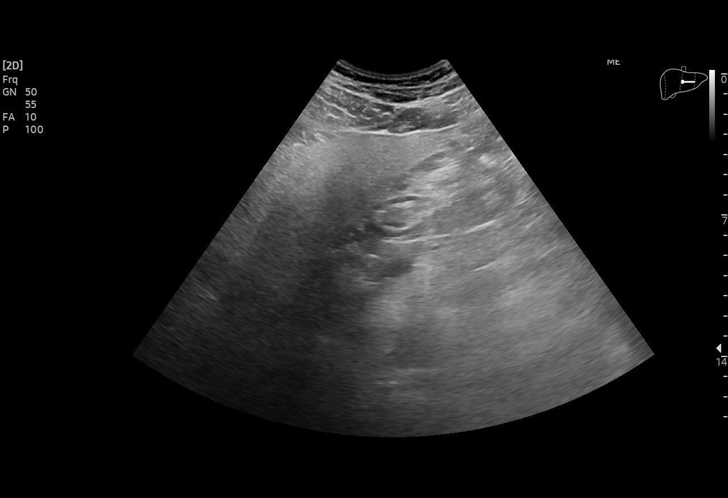
[im 30/42]
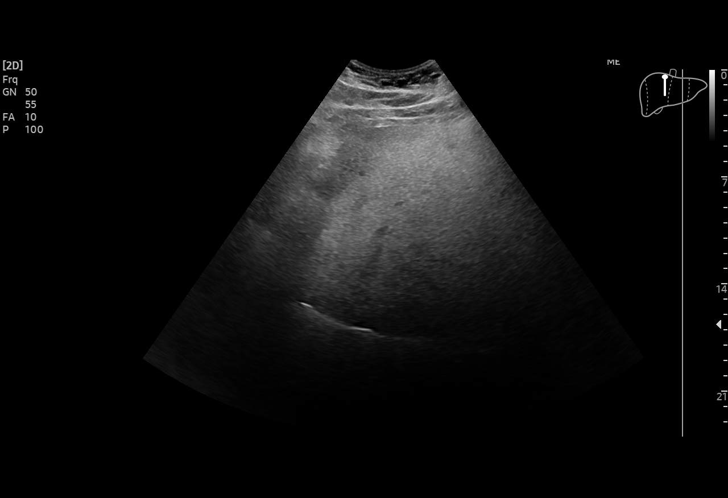
[im 33/42]
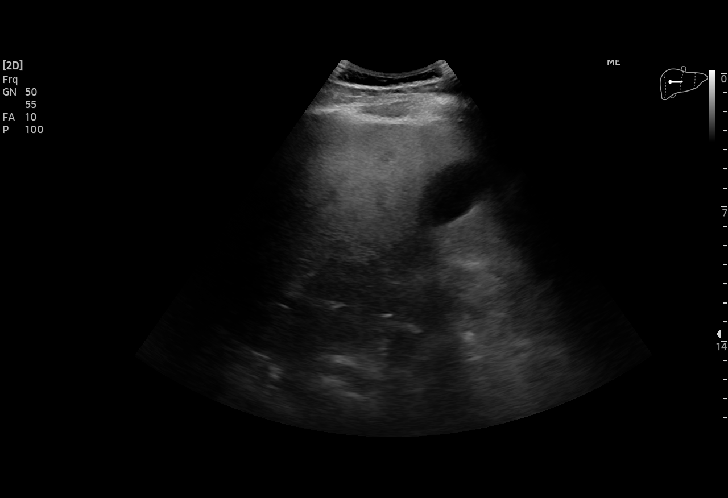
[im 35/42]
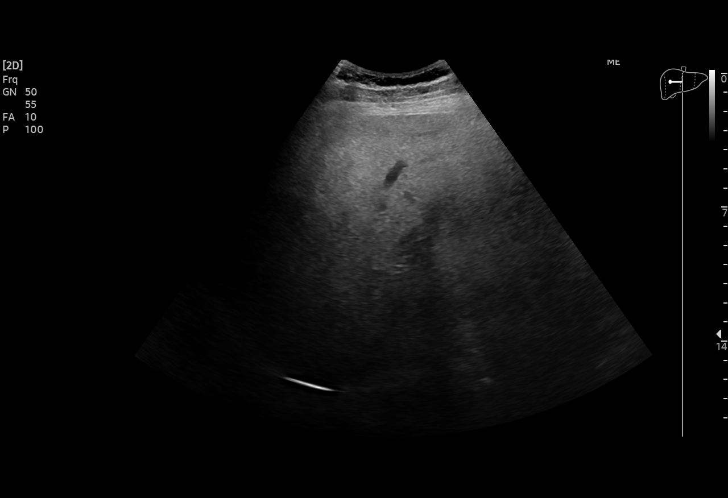
[im 38/42]
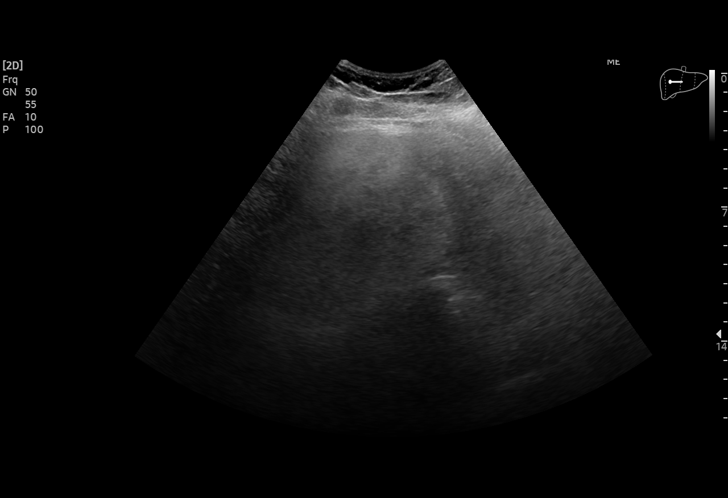
[im 42/42]
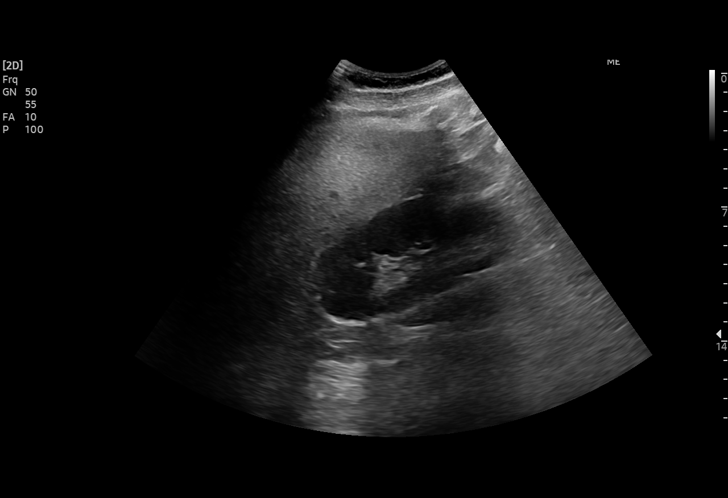

[15 of 25 positions shown; findings below may reference images not displayed]

FINDINGS: Gallbladder:

Multiple mobile gallstones, the largest 1.5 cm. No Murphy sign. No
wall thickening. No surrounding fluid.

Common bile duct:

Diameter: 4 mm common normal

Liver:

Diffusely echogenic consistent with steatosis. No focal lesion or
ductal dilatation. Portal vein is patent on color Doppler imaging
with normal direction of blood flow towards the liver.

Other: No ascites.
IMPRESSION: Diffusely echogenic liver consistent with steatosis.

Chololithiasis without sonographic evidence of cholecystitis or
obstruction.

## 2023-03-05 ENCOUNTER — Encounter: Payer: Self-pay | Admitting: Emergency Medicine

## 2023-03-05 ENCOUNTER — Other Ambulatory Visit: Payer: Self-pay

## 2023-03-05 ENCOUNTER — Emergency Department: Payer: Medicare Other

## 2023-03-05 ENCOUNTER — Emergency Department
Admission: EM | Admit: 2023-03-05 | Discharge: 2023-03-05 | Disposition: A | Discharge status: Home / Self Care | Payer: Medicare Other | Attending: Emergency Medicine | Admitting: Emergency Medicine

## 2023-03-05 DIAGNOSIS — E039 Hypothyroidism, unspecified: Secondary | ICD-10-CM | POA: Diagnosis not present

## 2023-03-05 DIAGNOSIS — Z23 Encounter for immunization: Secondary | ICD-10-CM | POA: Diagnosis not present

## 2023-03-05 DIAGNOSIS — S91114A Laceration without foreign body of right lesser toe(s) without damage to nail, initial encounter: Secondary | ICD-10-CM

## 2023-03-05 DIAGNOSIS — I10 Essential (primary) hypertension: Secondary | ICD-10-CM | POA: Insufficient documentation

## 2023-03-05 DIAGNOSIS — S93104A Unspecified dislocation of right toe(s), initial encounter: Secondary | ICD-10-CM | POA: Insufficient documentation

## 2023-03-05 DIAGNOSIS — W06XXXA Fall from bed, initial encounter: Secondary | ICD-10-CM | POA: Insufficient documentation

## 2023-03-05 DIAGNOSIS — S99921A Unspecified injury of right foot, initial encounter: Secondary | ICD-10-CM | POA: Diagnosis present

## 2023-03-05 MED ORDER — TETANUS-DIPHTH-ACELL PERTUSSIS 5-2.5-18.5 LF-MCG/0.5 IM SUSY
0.5000 mL | PREFILLED_SYRINGE | Freq: Once | INTRAMUSCULAR | Status: AC
  Administered 2023-03-05: 0.5 mL via INTRAMUSCULAR
  Filled 2023-03-05: qty 0.5

## 2023-03-05 MED ORDER — LIDOCAINE HCL (PF) 1 % IJ SOLN
5.0000 mL | Freq: Once | INTRAMUSCULAR | Status: AC
  Administered 2023-03-05: 5 mL via INTRADERMAL
  Filled 2023-03-05: qty 5

## 2023-03-05 MED ORDER — HYDROCODONE-ACETAMINOPHEN 5-325 MG PO TABS: 1.0000 | ORAL_TABLET | Freq: Four times a day (QID) | ORAL | 0 refills | Status: AC | PRN

## 2023-03-05 MED ORDER — LIDOCAINE HCL (PF) 1 % IJ SOLN
5.0000 mL | Freq: Once | INTRAMUSCULAR | Status: AC
  Administered 2023-03-05: 2.5 mL via INTRADERMAL
  Filled 2023-03-05: qty 5

## 2023-03-05 MED ORDER — CEPHALEXIN 500 MG PO CAPS: 500.0000 mg | ORAL_CAPSULE | Freq: Three times a day (TID) | ORAL | 0 refills | Status: AC

## 2023-03-05 NOTE — ED Triage Notes (Signed)
Pt from home via [facility]- states pt had mechanical fall this am and injured rt foot 4th toe. Denies LOC. VSS

## 2023-03-05 NOTE — Discharge Instructions (Addendum)
Follow up with dr Ether Griffins, if you do not hear from his office in a few days call them and tell them you were seen in the ER and need an appointment

## 2023-03-05 NOTE — ED Notes (Signed)
Buddy tape and gauze applied. Post op shoe applied.

## 2023-03-05 NOTE — ED Provider Notes (Signed)
[facility] Provider Note    Event Date/Time   First MD Initiated Contact with Patient 03/05/23 1326     (approximate)   History   Fall   HPI  Tony Beck is a 67 y.o. male with history of hypertension, hypothyroidism and DVT presents emergency department with a injury to the right foot.  Patient states he fell of the bed and hurt his toe.  States it is an awkward position and also has a laceration.  No other injuries reported.  No head injury.  Unsure of his last Tdap      Physical Exam   Triage Vital Signs: ED Triage Vitals [03/05/23 1250]  Enc Vitals Group     BP (!) 140/81     Pulse Rate (!) 59     Resp 18     Temp 99.1 F (37.3 C)     Temp Source Oral     SpO2 99 %     Weight      Height      Head Circumference      Peak Flow      Pain Score 5     Pain Loc      Pain Edu?      Excl. in GC?     Most recent vital signs: Vitals:   03/05/23 1250  BP: (!) 140/81  Pulse: (!) 59  Resp: 18  Temp: 99.1 F (37.3 C)  SpO2: 99%     General: Awake, no distress.   CV:  Good peripheral perfusion. regular rate and  rhythm Resp:  Normal effort. Abd:  No distention.   Other:  Right foot with dislocated right fourth toe, laceration noted around the base of the toe, able to see the bone at the dislocation emerging through the skin   ED Results / Procedures / Treatments   Labs (all labs ordered are listed, but only abnormal results are displayed) Labs Reviewed - No data to display   EKG     RADIOLOGY X-ray of the right foot    PROCEDURES:   .Ortho Injury Treatment  Date/Time: 03/05/2023 3:16 PM  Performed by: Faythe GheeFisher, Carmencita Cusic W, PA-C Authorized by: Faythe GheeFisher, Stephfon Bovey W, PA-C   Consent:    Consent obtained:  Verbal   Consent given by:  Patient   Risks discussed:  Fracture, nerve damage, restricted joint movement, vascular damage, stiffness, recurrent dislocation and irreducible dislocation   Alternatives discussed:  Delayed  treatmentInjury location: toe Location details: right fourth toe Injury type: dislocation Dislocation type: DIP Pre-procedure neurovascular assessment: neurovascularly intact Pre-procedure distal perfusion: normal Pre-procedure neurological function: normal Pre-procedure range of motion: normal Anesthesia: digital block  Anesthesia: Local anesthesia used: yes Local Anesthetic: lidocaine 1% without epinephrine  Patient sedated: NoManipulation performed: yes Reduction successful: yes Immobilization: tape Splint Applied by: ED Provider Post-procedure neurovascular assessment: post-procedure neurovascularly intact Post-procedure distal perfusion: normal Post-procedure neurological function: normal Post-procedure range of motion: normal   ..Laceration Repair  Date/Time: 03/05/2023 3:18 PM  Performed by: Faythe GheeFisher, Rhyen Mazariego W, PA-C Authorized by: Faythe GheeFisher, Jame Morrell W, PA-C   Consent:    Consent obtained:  Verbal   Consent given by:  Patient   Risks, benefits, and alternatives were discussed: yes     Risks discussed:  Infection, pain, retained foreign body, tendon damage, poor cosmetic result, need for additional repair, nerve damage, poor wound healing and vascular damage   Alternatives discussed:  Delayed treatment Universal protocol:    Procedure explained and questions answered to  patient or proxy's satisfaction: yes     Immediately prior to procedure, a time out was called: yes     Patient identity confirmed:  Verbally with patient Anesthesia:    Anesthesia method:  Nerve block   Block anesthetic:  Lidocaine 1% w/o epi   Block injection procedure:  Anatomic landmarks identified, introduced needle, incremental injection, anatomic landmarks palpated and negative aspiration for blood   Block outcome:  Anesthesia achieved Laceration details:    Location:  Toe   Toe location:  R fourth toe   Length (cm):  2 Pre-procedure details:    Preparation:  Patient was prepped and draped in usual  sterile fashion and imaging obtained to evaluate for foreign bodies Exploration:    Hemostasis achieved with:  Direct pressure   Imaging obtained: x-ray     Imaging outcome: foreign body not noted     Wound exploration: entire depth of wound visualized     Wound extent: underlying fracture     Wound extent: areolar tissue not violated, fascia not violated, no foreign body, no signs of injury, no nerve damage, no tendon damage and no vascular damage     Contaminated: no   Treatment:    Area cleansed with:  Povidone-iodine and saline   Amount of cleaning:  Standard   Irrigation solution:  Sterile saline   Irrigation method:  Syringe and tap Skin repair:    Repair method:  Sutures   Suture size:  5-0   Suture material:  Nylon   Suture technique:  Simple interrupted   Number of sutures:  4 Approximation:    Approximation:  Close Repair type:    Repair type:  Intermediate Post-procedure details:    Dressing:  Non-adherent dressing and splint for protection   Procedure completion:  Tolerated well, no immediate complications    MEDICATIONS ORDERED IN ED: Medications  lidocaine (PF) (XYLOCAINE) 1 % injection 5 mL (has no administration in time range)  lidocaine (PF) (XYLOCAINE) 1 % injection 5 mL (5 mLs Intradermal Given 03/05/23 1445)  Tdap (BOOSTRIX) injection 0.5 mL (0.5 mLs Intramuscular Given 03/05/23 1413)     IMPRESSION / MDM / ASSESSMENT AND PLAN / ED COURSE  I reviewed the triage vital signs and the nursing notes.                              Differential diagnosis includes, but is not limited to, fracture, dislocation, laceration, open fracture  Patient's presentation is most consistent with acute presentation with potential threat to life or bodily function.   See procedure note for laceration repair See procedure note for reduction  Due to the open skin along with dislocation in the area sliding in and out will place patient on antibiotic to prevent infection.  He is  established with Dr. Ether Griffins so we will have him follow-up with Dr. Ether Griffins for reevaluation.  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  Tdap was updated here in the ED.  He is given prescription for Keflex, hydrocodone if needed for pain.  He was discharged in stable condition.   Shared visit with dr Cyril Loosen, helped with reduction, evaluated patient   FINAL CLINICAL IMPRESSION(S) / ED DIAGNOSES   Final diagnoses:  Dislocated toe, right, initial encounter  Laceration of fourth toe of right foot, initial encounter     Rx / DC Orders   ED Discharge Orders  Ordered    cephALEXin (KEFLEX) 500 MG capsule  3 times daily        03/05/23 1502    HYDROcodone-acetaminophen (NORCO/VICODIN) 5-325 MG tablet  Every 6 hours PRN        03/05/23 1516             Note:  This document was prepared using Dragon voice recognition software and may include unintentional dictation errors.    Faythe Ghee, PA-C 03/05/23 1520    Jene Every, MD 03/05/23 [phone]

## 2023-03-05 NOTE — ED Triage Notes (Signed)
Patient to ED via [facility] from home after a mechanical fall. Patient states he was getting up out of the bed to answer the phone when he fell. C/o right toe pain with cut. Bleeding controlled at this time- wrapped by EMS. Injured to 4th toe on right foot.

## 2023-11-05 ENCOUNTER — Other Ambulatory Visit: Payer: Self-pay | Admitting: Internal Medicine

## 2023-11-05 DIAGNOSIS — I129 Hypertensive chronic kidney disease with stage 1 through stage 4 chronic kidney disease, or unspecified chronic kidney disease: Secondary | ICD-10-CM

## 2023-12-11 ENCOUNTER — Ambulatory Visit: Payer: Medicare Other
